# Patient Record
Sex: Female | Born: 1980 | Race: White | Hispanic: No | Marital: Married | Smoking: Former smoker
Health system: Southern US, Community
[De-identification: ages and names within clinical notes are randomized; demographics above are authoritative.]

## PROBLEM LIST (undated history)

## (undated) DIAGNOSIS — E162 Hypoglycemia, unspecified: Secondary | ICD-10-CM

## (undated) DIAGNOSIS — Z8619 Personal history of other infectious and parasitic diseases: Secondary | ICD-10-CM

## (undated) DIAGNOSIS — G7100 Muscular dystrophy, unspecified: Secondary | ICD-10-CM

## (undated) DIAGNOSIS — R0789 Other chest pain: Secondary | ICD-10-CM

## (undated) DIAGNOSIS — G7102 Facioscapulohumeral muscular dystrophy: Secondary | ICD-10-CM

## (undated) DIAGNOSIS — K219 Gastro-esophageal reflux disease without esophagitis: Secondary | ICD-10-CM

## (undated) DIAGNOSIS — F4310 Post-traumatic stress disorder, unspecified: Secondary | ICD-10-CM

## (undated) DIAGNOSIS — R51 Headache: Secondary | ICD-10-CM

## (undated) HISTORY — DX: Muscular dystrophy, unspecified: G71.00

## (undated) HISTORY — DX: Other chest pain: R07.89

## (undated) HISTORY — DX: Headache: R51

## (undated) HISTORY — PX: TONSILLECTOMY: SUR1361

## (undated) HISTORY — PX: TUBAL LIGATION: SHX77

## (undated) HISTORY — DX: Personal history of other infectious and parasitic diseases: Z86.19

---
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
## 2002-06-06 ENCOUNTER — Emergency Department (HOSPITAL_COMMUNITY): Admission: EM | Admit: 2002-06-06 | Discharge: 2002-06-06 | Payer: Self-pay | Admitting: Emergency Medicine

## 2002-07-06 ENCOUNTER — Observation Stay (HOSPITAL_COMMUNITY): Admission: AD | Admit: 2002-07-06 | Discharge: 2002-07-07 | Payer: Self-pay | Admitting: Obstetrics and Gynecology

## 2002-07-13 ENCOUNTER — Inpatient Hospital Stay (HOSPITAL_COMMUNITY): Admission: AD | Admit: 2002-07-13 | Discharge: 2002-07-17 | Payer: Self-pay | Admitting: Internal Medicine

## 2003-07-23 ENCOUNTER — Emergency Department (HOSPITAL_COMMUNITY): Admission: EM | Admit: 2003-07-23 | Discharge: 2003-07-23 | Payer: Self-pay | Admitting: Emergency Medicine

## 2004-09-13 ENCOUNTER — Other Ambulatory Visit: Admission: RE | Admit: 2004-09-13 | Discharge: 2004-09-13 | Payer: Self-pay

## 2005-12-18 ENCOUNTER — Emergency Department (HOSPITAL_COMMUNITY): Admission: EM | Admit: 2005-12-18 | Discharge: 2005-12-18 | Payer: Self-pay | Admitting: Emergency Medicine

## 2005-12-21 ENCOUNTER — Emergency Department (HOSPITAL_COMMUNITY): Admission: EM | Admit: 2005-12-21 | Discharge: 2005-12-21 | Payer: Self-pay | Admitting: Emergency Medicine

## 2006-12-28 ENCOUNTER — Emergency Department (HOSPITAL_COMMUNITY): Admission: EM | Admit: 2006-12-28 | Discharge: 2006-12-28 | Payer: Self-pay | Admitting: Emergency Medicine

## 2007-03-31 ENCOUNTER — Emergency Department (HOSPITAL_COMMUNITY): Admission: EM | Admit: 2007-03-31 | Discharge: 2007-03-31 | Payer: Self-pay | Admitting: Emergency Medicine

## 2007-08-08 ENCOUNTER — Emergency Department (HOSPITAL_COMMUNITY): Admission: EM | Admit: 2007-08-08 | Discharge: 2007-08-08 | Payer: Self-pay | Admitting: Emergency Medicine

## 2007-12-05 ENCOUNTER — Emergency Department (HOSPITAL_COMMUNITY): Admission: EM | Admit: 2007-12-05 | Discharge: 2007-12-06 | Payer: Self-pay | Admitting: Emergency Medicine

## 2008-01-14 ENCOUNTER — Emergency Department (HOSPITAL_COMMUNITY): Admission: EM | Admit: 2008-01-14 | Discharge: 2008-01-15 | Payer: Self-pay | Admitting: Emergency Medicine

## 2008-04-07 ENCOUNTER — Emergency Department (HOSPITAL_COMMUNITY): Admission: EM | Admit: 2008-04-07 | Discharge: 2008-04-08 | Payer: Self-pay | Admitting: Emergency Medicine

## 2008-04-08 ENCOUNTER — Ambulatory Visit (HOSPITAL_COMMUNITY): Admission: RE | Admit: 2008-04-08 | Discharge: 2008-04-08 | Payer: Self-pay | Admitting: Emergency Medicine

## 2008-04-09 ENCOUNTER — Emergency Department (HOSPITAL_COMMUNITY): Admission: EM | Admit: 2008-04-09 | Discharge: 2008-04-10 | Payer: Self-pay | Admitting: Emergency Medicine

## 2010-11-25 NOTE — Discharge Summary (Signed)
NAME:  [name] Hampton, [name] Hampton NO.:  [id number]   MEDICAL RECORD NO.:  [id number]                   PATIENT TYPE:  INP   LOCATION:  A428                                 FACILITY:  [facility]   PHYSICIAN:  Tilda Burrow, M.D.              DATE OF BIRTH:  [birth year]   DATE OF ADMISSION:  07/13/2002  DATE OF DISCHARGE:  07/17/2002                                 DISCHARGE SUMMARY   ADMISSION DIAGNOSES:  1. Pregnancy 40 weeks 4 days.  2. Induction.  3. Maternal fatigue and discomfort.  4. Impending postdates.   DISCHARGE DIAGNOSES:  1. Pregnancy 40 weeks 4 days, delivered.  2. Failed medical induction.  3. Cephalopelvic disproportion.   PROCEDURE:  1. Cytotec cervical ripening performed on July 13, 2002.  2. Pitocin induction of labor July 14, 2002.  3. Primary low transverse cervical cesarean section, Tilda Burrow, M.D.,     July 14, 2002.   HISTORY OF PRESENT ILLNESS:  This 30 year old female, gravida 1, para 0,  abortus 0, last menstrual period September 17, 2001, and ultrasound with [facility] of  July 25, 2002, based on a six-week ultrasound with corrected EDC based on  later ultrasound of July 09, 2002, was admitted at 40 weeks 4 days by  the later criteria after prenatal course followed initially in [place] and  transferred to our facility at 28 weeks.  The patient has been having  multiple somatic complaints over the past few weeks and is admitted as  vigorously requested by the patient and with the concern of possible  postdates issues being soon to develop.  The patient was admitted as  described in the admitting history on the evening of July 13, 2002.   HOSPITAL COURSE:  The patient was admitted with blood type A positive,  rubella immunity equivocal.  Hemoglobin 10.8, hematocrit 31.2.  Cervical  exam showed the cervix to be soft, closed, and quite high.  The patient was  admitted and begun on Cytotec cervical ripening through the  night.  She  received three doses of Cytotec 25 mcg q.4h.  The following day she was  begun on Pitocin induction at 9 a.m.  She progressed with membrane rupture  at 4:15 p.m.  The cervix was 1, thick, posterior, -3.  Induction of labor  persisted.  The presenting part never entered the pelvis.  Late on the  evening of July 14, 2002, induction was considered unsuccessful.  The  presenting part never entered the pelvic inlet.  In talking with the  patient, none of her siblings have been able to delivery vaginally either.  Primary low transverse cervical cesarean section was performed without  difficulty other than the fact that the Foley was not effectively draining.  A needle cystotomy solved that problem.  She had a healthy female infant,  Apgars of 8 and 9.  Estimated blood loss was 400 cc.  Postoperative course  was uneventful, with the patient stable for discharge on July 17, 2002,  tolerating a regular diet.   DISCHARGE MEDICATIONS:  1. HCTZ 25 mg p.o. daily x 1 week.  2. Tylox 1 q.4h. p.r.n. pain, dispense 20.  3. Micronor to be used as oral contraception.   The patient is breast feeding and will be followed up in four weeks for a  postoperative check.                                                 Tilda Burrow, M.D.    JVF/MEDQ  D:  07/17/2002  T:  07/17/2002  Job:  [id number]

## 2010-11-25 NOTE — H&P (Signed)
   [name], [name]                             ACCOUNT NO.:  [id number]   MEDICAL RECORD NO.:  [id number]                  PATIENT TYPE:   LOCATION:                                       FACILITY:   PHYSICIAN:  Tilda Burrow, M.D.              DATE OF BIRTH:   DATE OF ADMISSION:  07/13/2002  DATE OF DISCHARGE:                                HISTORY & PHYSICAL   REASON FOR ADMISSION:  Pregnancy at 40 weeks and 4 days for induction of  labor due to maternal fatigue and discomfort.   HISTORY OF PRESENT ILLNESS:  After a lengthy discussion with the patient,  the risks and benefits of induction, the patient desires induction of labor  due to fatigue and pain.   PAST MEDICAL HISTORY:  Medical history is positive for asthma and a 56-month  FSH dystrophy, ovarian cyst.   PAST SURGICAL HISTORY:  Negative.   ALLERGIES:  She is allergic to erythromycin.   MEDICATIONS:  She is on prenatal vitamins.   SOCIAL HISTORY:  She is single.  The boyfriend is present and supportive.   PRENATAL COURSE:  The prenatal course has essentially been uncomplicated.  Blood type is A positive.  Rubella is equivocal.  Hepatitis B surface  antigen is negative.  HIV is negative.  Serology is nonreactive.  GC and  Chlamydia.  Chlamydia was positive, with treatment and retest, Chlamydia was  negative. GBS is positive.  A 28-week hemoglobin 11.8, 28-week hematocrit  34.8, 1-hour glucose tolerance 54.   PHYSICAL EXAMINATION:  VITAL SIGNS:  Weight is 115-3/4.  Blood pressure  110/80.  ABDOMEN:  There is good fetal movement.  Fundal height is 35 cm.  Fetal  heart is 130 strong and regular.  PELVIC:  Cervix is soft, midposition, closed, and minus 1 station.   PLAN:  We are going to admit for Cytotec induction of labor Sunday night and  expect vaginal delivery sometime during the day on Monday, 07/14/02.     Zerita Boers, Reita Cliche, M.D.    DL/MEDQ  D:  78/29/5621  T:   07/09/2002  Job:  [id number]   cc:   [facility]

## 2010-11-25 NOTE — Op Note (Signed)
NAME:  [name] Hampton, [name] Hampton NO.:  [id number]   MEDICAL RECORD NO.:  [id number]                   PATIENT TYPE:  OBV   LOCATION:  A428                                 FACILITY:  [facility]   PHYSICIAN:  Tilda Burrow, M.D.              DATE OF BIRTH:  1980/11/24   DATE OF PROCEDURE:  DATE OF DISCHARGE:                                 OPERATIVE REPORT   PREOPERATIVE DIAGNOSIS:  Pregnancy 40 week 4 days, failed induction  (cephalopelvic disproportion).   POSTOPERATIVE DIAGNOSIS:  Pregnancy 40 week 4 days, failed induction  (cephalopelvic disproportion), delivered.   PROCEDURE:  Primary low transverse cervical cesarean section.   SURGEON:  Tilda Burrow, M.D.   ASSISTANT:  Zerita Boers, C.N.M.   ANESTHESIA:  Spinal by C. H. CRNA   COMPLICATIONS:  None.   FINDINGS:  1. Foley catheter not draining effectively and necessitating natal     cystotomy.  2. Healthy female infant weight ___________; Apgars 8 and 9.  Cared for by Marcelle Overlie, RN   COMPLICATIONS:  None.   INDICATIONS:  This is a 30 year old female admitted last night at 40 weeks 4  days for induction due to impending postdate status.  The patient has a  family history including all the girls having had C-sections and never  entering the pelvis.  The presumed ______________ was extremely high and  despite Cytotec and ripening of the cervix x12 hours followed by Pitocin  induction, the presenting part never entered the pelvis.  Membranes were  ruptured and after 12 hours of Pitocin, we proceeded with cesarean section.   DETAILS OF PROCEDURE:  The patient was taken to the operating room and  prepped and draped for lower abdominal surgery.  Anesthesia was confirmed  and a Pfannenstiel-type incision performed.  The bladder was very full and  quite high on the anterior abdominal wall.  We were able to enter the  abdomen above it.  Needle cystotomy was performed and suction attached to  the  needle and drained the bladder quite effectively.  Bladder flap was  developed on the very well-developed, thinned out, lower uterine segment.  A  transverse nick made down to the baby and then extended laterally using  index finger traction.   The fetal vertex then delivered and the baby expelled with fundal pressure.  The cord was milked towards the infant, clamped, and then the infant passed  to the waiting nurse, Ms. Duffy Rhody, RN.   The placenta then was delivered [facility] presentation; a small membrane  fragment was identified above the cervix and removed.  Antibiotic irrigation  of the uterus was followed by a single layer of running locking closure of  the uterus and 2-0 chromic bladder flap reapproximation.   The abdominal cavity was irrigated with antibiotic solution.  The anterior  peritoneum was closed using 2-0 chromic.  The bladder  began to refill quite  generously.  The rectus muscles pulled back in the midline with two  interrupted 2-0 chromic stitches; and then the fascia was closed with #0  Vicryl.  The subcutaneous tissues were reapproximated with 3 stitches of 2-0  chromic; and staple closure of the skin completed the procedure.  The  patient went to the recovery room in good condition; whereupon, the Foley  catheter was replaced in the operative room before the patient was  transferred.                                               Tilda Burrow, M.D.    JVF/MEDQ  D:  07/14/2002  T:  07/15/2002  Job:  [id number]

## 2011-04-06 LAB — URINALYSIS, ROUTINE W REFLEX MICROSCOPIC
Hgb urine dipstick: NEGATIVE
Nitrite: NEGATIVE
Protein, ur: NEGATIVE
Specific Gravity, Urine: >1.03 — ABNORMAL HIGH
Urobilinogen, UA: 0.2

## 2011-04-10 LAB — CBC
Hemoglobin: 15.6 — ABNORMAL HIGH
MCV: 90.9
RBC: 4.98
WBC: 16.5 — ABNORMAL HIGH

## 2011-04-10 LAB — URINALYSIS, ROUTINE W REFLEX MICROSCOPIC
Protein, ur: NEGATIVE
Specific Gravity, Urine: >1.03 — ABNORMAL HIGH

## 2011-04-10 LAB — DIFFERENTIAL
Eosinophils Relative: 1
Lymphocytes Relative: 5 — ABNORMAL LOW
Lymphs Abs: 0.9
Monocytes Absolute: 0.2
Monocytes Relative: 1 — ABNORMAL LOW

## 2011-04-10 LAB — HEPATIC FUNCTION PANEL
Alkaline Phosphatase: 177 — ABNORMAL HIGH
Indirect Bilirubin: 0.4
Total Protein: 5.7 — ABNORMAL LOW

## 2011-04-10 LAB — BASIC METABOLIC PANEL
CO2: 27
Calcium: 8.2 — ABNORMAL LOW
Chloride: 100
GFR calc non Af Amer: 46 — ABNORMAL LOW
Glucose, Bld: 204 — ABNORMAL HIGH

## 2011-04-10 LAB — PREGNANCY, URINE: Preg Test, Ur: NEGATIVE

## 2011-04-20 LAB — URINE CULTURE: Colony Count: >=100000

## 2011-04-20 LAB — URINALYSIS, ROUTINE W REFLEX MICROSCOPIC
Nitrite: POSITIVE — AB
Protein, ur: NEGATIVE
Specific Gravity, Urine: 1.015
Urobilinogen, UA: 0.2

## 2011-04-20 LAB — URINE MICROSCOPIC-ADD ON

## 2011-06-23 ENCOUNTER — Encounter: Payer: Self-pay | Admitting: Emergency Medicine

## 2011-06-23 ENCOUNTER — Emergency Department (HOSPITAL_COMMUNITY)
Admission: EM | Admit: 2011-06-23 | Discharge: 2011-06-23 | Disposition: A | Discharge status: Home / Self Care | Payer: Self-pay | Attending: Emergency Medicine | Admitting: Emergency Medicine

## 2011-06-23 DIAGNOSIS — J329 Chronic sinusitis, unspecified: Secondary | ICD-10-CM | POA: Insufficient documentation

## 2011-06-23 DIAGNOSIS — F17200 Nicotine dependence, unspecified, uncomplicated: Secondary | ICD-10-CM | POA: Insufficient documentation

## 2011-06-23 MED ORDER — NAPROXEN 375 MG PO TABS: 375.0000 mg | ORAL_TABLET | Freq: Two times a day (BID) | ORAL | Status: AC

## 2011-06-23 MED ORDER — SALINE NASAL SPRAY 0.65 % NA SOLN: 1.0000 | NASAL | Status: DC | PRN

## 2011-06-23 MED ORDER — OXYMETAZOLINE HCL 0.05 % NA SOLN
1.0000 | Freq: Once | NASAL | Status: AC
  Administered 2011-06-23: 1 via NASAL
  Filled 2011-06-23: qty 15

## 2011-06-23 MED ORDER — OXYMETAZOLINE HCL 0.05 % NA SOLN: 2.0000 | Freq: Two times a day (BID) | NASAL | Status: AC

## 2011-06-23 NOTE — ED Provider Notes (Signed)
History     CSN: [id number] Arrival date & time: 06/23/2011  1:21 AM   First MD Initiated Contact with Patient 06/23/11 0122      Chief Complaint  Patient presents with  . Facial Pain    (Consider location/radiation/quality/duration/timing/severity/associated sxs/prior treatment) The history is provided by the patient.   left sided sinus pressure the last 24 hours. She had some of the right side last week which is now resolved. No fevers or chills. Has had some posterior nasal drip. No nausea or vomiting. No known sick contacts. No shortness of breath or difficulty breathing. No sore throat. Patient is mostly concerned tonight because she knows someone in a serious dental infection that ultimately ended up dying from a related infection. She denies any dental pain, facial swelling, trouble swallowing or history of dental problems. She believes that she has a sinus infection. She is a smoker. Symptoms are mild to moderate in severity. No radiation of pain. Pain is sharp in quality. No other associated symptoms. No alleviating factors. She has not tried any medications for this at home.  History reviewed. No pertinent past medical history.  Past Surgical History  Procedure Date  . Cesarean section   . Abdominal surgery   . Tonsillectomy     No family history on file.  History  Substance Use Topics  . Smoking status: Current Everyday Smoker -- 0.5 packs/day    Types: Cigarettes  . Smokeless tobacco: Not on file  . Alcohol Use: Yes    OB History    Grav Para Term Preterm Abortions TAB SAB Ect Mult Living                  Review of Systems  Constitutional: Negative for fever and chills.  HENT: Positive for congestion and sinus pressure. Negative for ear pain, nosebleeds, sore throat, sneezing, drooling, mouth sores, trouble swallowing, neck pain, neck stiffness, dental problem, voice change and tinnitus.   Eyes: Negative for pain.  Respiratory: Negative for shortness of  breath.   Cardiovascular: Negative for chest pain, palpitations and leg swelling.  Gastrointestinal: Negative for abdominal pain.  Genitourinary: Negative for dysuria.  Musculoskeletal: Negative for back pain.  Skin: Negative for rash.  Neurological: Negative for headaches.  All other systems reviewed and are negative.    Allergies  Erythromycin and Peppermint oil  Home Medications  No current outpatient prescriptions on file.  BP 119/53  Pulse 88  Temp(Src) 97.6 F (36.4 C) (Oral)  Resp 18  Ht 5' 1.5" (1.562 m)  Wt 135 lb (61.236 kg)  BMI 25.10 kg/m2  SpO2 100%  LMP 06/05/2011  Physical Exam  Constitutional: She is oriented to person, place, and time. She appears well-developed and well-nourished.  HENT:  Head: Normocephalic and atraumatic.  Mouth/Throat: Oropharynx is clear and moist. No oropharyngeal exudate.       Mild nasal congestion with tenderness over left maxillary sinus. No dental tenderness or gingival swelling or associated facial swelling. No tenderness over TMJs. No trismus  Eyes: Conjunctivae and EOM are normal. Pupils are equal, round, and reactive to light.  Neck: Trachea normal. Neck supple. No thyromegaly present.  Cardiovascular: Normal rate, regular rhythm, S1 normal, S2 normal and normal pulses.     No systolic murmur is present   No diastolic murmur is present  Pulses:      Radial pulses are 2+ on the right side, and 2+ on the left side.  Pulmonary/Chest: Effort normal and breath sounds normal. She has  no wheezes. She has no rhonchi. She has no rales. She exhibits no tenderness.  Abdominal: Soft. Normal appearance and bowel sounds are normal. There is no tenderness. There is no CVA tenderness and negative Murphy's sign.  Musculoskeletal:       BLE:s Calves nontender, no cords or erythema, negative Homans sign  Neurological: She is alert and oriented to person, place, and time. She has normal strength. No cranial nerve deficit or sensory deficit.  GCS eye subscore is 4. GCS verbal subscore is 5. GCS motor subscore is 6.  Skin: Skin is warm and dry. No rash noted. She is not diaphoretic.  Psychiatric: Her speech is normal.       Cooperative and appropriate    ED Course  Procedures (including critical care time)  Afrin for nasal congestion and prescription for same with NSAIDs and primary care followup as needed.   MDM  Presentation suggest sinus infection and not dental infection. No trismus or TMJ. No meningismus or evidence of serious bacterial infection by clinical exam. Patient stable for discharge home with prescription for Afrin and Naprosyn. No indication for antibiotics at this time        Sunnie Nielsen, MD 06/23/11 [phone]

## 2011-06-23 NOTE — ED Notes (Signed)
Patient c/o left sided facial pain; states had pain on right side last week and now hurts on left cheekbone.

## 2011-07-05 DIAGNOSIS — F17200 Nicotine dependence, unspecified, uncomplicated: Secondary | ICD-10-CM | POA: Insufficient documentation

## 2011-07-05 DIAGNOSIS — J329 Chronic sinusitis, unspecified: Secondary | ICD-10-CM | POA: Insufficient documentation

## 2011-07-05 DIAGNOSIS — J3489 Other specified disorders of nose and nasal sinuses: Secondary | ICD-10-CM | POA: Insufficient documentation

## 2011-07-05 DIAGNOSIS — R51 Headache: Secondary | ICD-10-CM | POA: Insufficient documentation

## 2011-07-05 DIAGNOSIS — Z79899 Other long term (current) drug therapy: Secondary | ICD-10-CM | POA: Insufficient documentation

## 2011-07-06 ENCOUNTER — Emergency Department (HOSPITAL_COMMUNITY)
Admission: EM | Admit: 2011-07-06 | Discharge: 2011-07-06 | Disposition: A | Discharge status: Home / Self Care | Payer: Self-pay | Attending: Emergency Medicine | Admitting: Emergency Medicine

## 2011-07-06 ENCOUNTER — Encounter (HOSPITAL_COMMUNITY): Payer: Self-pay | Admitting: Emergency Medicine

## 2011-07-06 DIAGNOSIS — J329 Chronic sinusitis, unspecified: Secondary | ICD-10-CM

## 2011-07-06 MED ORDER — ACETAMINOPHEN 500 MG PO TABS
1000.0000 mg | ORAL_TABLET | Freq: Once | ORAL | Status: AC
  Administered 2011-07-06: 1000 mg via ORAL
  Filled 2011-07-06: qty 2

## 2011-07-06 MED ORDER — DOXYCYCLINE HYCLATE 100 MG PO CAPS: 100.0000 mg | ORAL_CAPSULE | Freq: Two times a day (BID) | ORAL | Status: AC

## 2011-07-06 MED ORDER — IBUPROFEN 800 MG PO TABS
800.0000 mg | ORAL_TABLET | Freq: Once | ORAL | Status: AC
  Administered 2011-07-06: 800 mg via ORAL
  Filled 2011-07-06: qty 1

## 2011-07-06 MED ORDER — PSEUDOEPHEDRINE HCL 60 MG PO TABS
60.0000 mg | ORAL_TABLET | Freq: Once | ORAL | Status: AC
  Administered 2011-07-06: 60 mg via ORAL
  Filled 2011-07-06: qty 1

## 2011-07-06 MED ORDER — DOXYCYCLINE HYCLATE 100 MG PO TABS
100.0000 mg | ORAL_TABLET | Freq: Once | ORAL | Status: AC
  Administered 2011-07-06: 100 mg via ORAL
  Filled 2011-07-06: qty 1

## 2011-07-06 MED ORDER — HYDROCODONE-ACETAMINOPHEN 5-500 MG PO TABS: 1.0000 | ORAL_TABLET | ORAL | Status: AC | PRN

## 2011-07-06 MED ORDER — PSEUDOEPHEDRINE HCL 60 MG PO TABS: 60.0000 mg | ORAL_TABLET | ORAL | Status: AC | PRN

## 2011-07-06 MED ORDER — ONDANSETRON HCL 4 MG PO TABS
4.0000 mg | ORAL_TABLET | Freq: Once | ORAL | Status: AC
  Administered 2011-07-06: 4 mg via ORAL
  Filled 2011-07-06: qty 1

## 2011-07-06 NOTE — ED Provider Notes (Signed)
History     CSN: [id number]  Arrival date & time 07/05/11  2355   First MD Initiated Contact with Patient 07/05/11 2359      Chief Complaint  Patient presents with  . Facial Pain    (Consider location/radiation/quality/duration/timing/severity/associated sxs/prior treatment) HPI Comments: Patient complains of right-sided facial pain. It is of note the patient was treated on December 14 for similar type of pain on the left side of the face. She was diagnosed with sinus infection. And treated with NSAID and Afrin spray. The patient states that she continues to have pain. The pain got worse today to the point of hurting with smiling and hurting with chewing at times. The patient denies any recent injury. She denies high fevers. She does report that friends have noticed swelling of her face from time to time during this past one to 2 week period.  Patient states she has had chronic recurrent sinus infections. She has not seen a specialist because of financial situations. She states that usually if she gets an antibiotic the problem resolves quickly. She requests to be evaluated and request an antibiotic tonight.  The history is provided by the patient.    History reviewed. No pertinent past medical history.  Past Surgical History  Procedure Date  . Cesarean section   . Abdominal surgery   . Tonsillectomy     No family history on file.  History  Substance Use Topics  . Smoking status: Current Everyday Smoker -- 0.5 packs/day    Types: Cigarettes  . Smokeless tobacco: Not on file  . Alcohol Use: Yes    OB History    Grav Para Term Preterm Abortions TAB SAB Ect Mult Living                  Review of Systems  Constitutional: Positive for appetite change.  HENT: Positive for facial swelling and sinus pressure. Negative for voice change.     Allergies  Erythromycin and Peppermint oil  Home Medications   Current Outpatient Rx  Name Route Sig Dispense Refill  .  DOXYCYCLINE HYCLATE 100 MG PO CAPS Oral Take 1 capsule (100 mg total) by mouth 2 (two) times daily. 14 capsule 0  . HYDROCODONE-ACETAMINOPHEN 5-500 MG PO TABS Oral Take 1-2 tablets by mouth every 4 (four) hours as needed for pain. 20 tablet 0  . NAPROXEN 375 MG PO TABS Oral Take 1 tablet (375 mg total) by mouth 2 (two) times daily. 20 tablet 0  . PSEUDOEPHEDRINE HCL 60 MG PO TABS Oral Take 1 tablet (60 mg total) by mouth every 4 (four) hours as needed for congestion. 24 tablet 0  . SALINE NASAL SPRAY 0.65 % NA SOLN Nasal Place 1 spray into the nose as needed for congestion. 30 mL 12    BP 126/82  Pulse 83  Temp(Src) 97.5 F (36.4 C) (Oral)  Resp 18  Ht 5' 1.5" (1.562 m)  Wt 135 lb (61.236 kg)  BMI 25.10 kg/m2  SpO2 100%  LMP 05/29/2011  Physical Exam  Nursing note and vitals reviewed. Constitutional: She is oriented to person, place, and time. She appears well-developed and well-nourished.  Non-toxic appearance. No distress.  HENT:  Head: Normocephalic.  Right Ear: Tympanic membrane and external ear normal.  Left Ear: Tympanic membrane and external ear normal.       External auditory canals and tympanic membranes are clear bilaterally. There is no swelling or exudate of the posterior pharynx.there is pain to percussion over the  sinuses on the right. There is no increased warmth of the right or left face. No visible dental abscess or significant gum swelling.  Eyes: EOM and lids are normal. Pupils are equal, round, and reactive to light.  Neck: Normal range of motion. Neck supple. Carotid bruit is not present.  Cardiovascular: Normal rate, regular rhythm, normal heart sounds, intact distal pulses and normal pulses.   Pulmonary/Chest: Breath sounds normal. No respiratory distress.  Abdominal: Soft. Bowel sounds are normal. There is no tenderness. There is no guarding.  Musculoskeletal: Normal range of motion.  Lymphadenopathy:       Head (right side): No submandibular adenopathy  present.       Head (left side): No submandibular adenopathy present.    She has no cervical adenopathy.  Neurological: She is alert and oriented to person, place, and time. She has normal strength. No cranial nerve deficit or sensory deficit.  Skin: Skin is warm and dry.  Psychiatric: She has a normal mood and affect. Her speech is normal.    ED Course  Procedures (including critical care time)  Labs Reviewed - No data to display No results found.   1. Sinus infection       MDM  I have reviewed nursing notes, vital signs, and all appropriate lab and imaging results for this patient. Suspect sinus infection. Vital signs are stable. Since there is a history of recurrent infection, with response to antibiotic therapy. Will treat with doxycycline, as patient to continue Naprosyn, and add Vicodin for assistance with pain. Referral to ear nose and throat strongly suggested to the patient.       Kathie Dike, [place] 07/06/11 [phone]

## 2011-07-06 NOTE — ED Provider Notes (Signed)
Medical screening examination/treatment/procedure(s) were performed by non-physician practitioner and as supervising physician I was immediately available for consultation/collaboration.   Lyanne Co, MD 07/06/11 [phone]

## 2011-07-06 NOTE — ED Notes (Signed)
Patient c/o right sided facial pain; states was seen here about 2 weeks ago for same thing on left side.

## 2011-07-12 ENCOUNTER — Emergency Department (HOSPITAL_COMMUNITY): Payer: Self-pay

## 2011-07-12 ENCOUNTER — Encounter (HOSPITAL_COMMUNITY): Payer: Self-pay

## 2011-07-12 ENCOUNTER — Emergency Department (HOSPITAL_COMMUNITY)
Admission: EM | Admit: 2011-07-12 | Discharge: 2011-07-12 | Disposition: A | Discharge status: Home / Self Care | Payer: Self-pay | Attending: Emergency Medicine | Admitting: Emergency Medicine

## 2011-07-12 DIAGNOSIS — G7109 Other specified muscular dystrophies: Secondary | ICD-10-CM | POA: Insufficient documentation

## 2011-07-12 DIAGNOSIS — J019 Acute sinusitis, unspecified: Secondary | ICD-10-CM | POA: Insufficient documentation

## 2011-07-12 DIAGNOSIS — J45909 Unspecified asthma, uncomplicated: Secondary | ICD-10-CM | POA: Insufficient documentation

## 2011-07-12 DIAGNOSIS — F17200 Nicotine dependence, unspecified, uncomplicated: Secondary | ICD-10-CM | POA: Insufficient documentation

## 2011-07-12 DIAGNOSIS — J329 Chronic sinusitis, unspecified: Secondary | ICD-10-CM

## 2011-07-12 HISTORY — DX: Facioscapulohumeral muscular dystrophy: G71.02

## 2011-07-12 MED ORDER — DOXYCYCLINE HYCLATE 100 MG PO CAPS: 100.0000 mg | ORAL_CAPSULE | Freq: Two times a day (BID) | ORAL | Status: AC

## 2011-07-12 MED ORDER — HYDROCODONE-ACETAMINOPHEN 5-325 MG PO TABS: 1.0000 | ORAL_TABLET | Freq: Four times a day (QID) | ORAL | Status: AC | PRN

## 2011-07-12 NOTE — ED Notes (Signed)
Pt reports approx three weeks ago she began having jaw pain that alternated between the right and left sides.  Pt states she was seen in ED and given antibiotic.  Pt reports pain has gotten worse and she is unable to sleep.  Pt reports pain is primarily on right side.  Pt reports soreness that starts behind right ear and radiates to right side of mouth.  NAD at this time.

## 2011-07-12 NOTE — ED Notes (Signed)
Pt presents with right sided facial and jaw pain x 3 weeks.

## 2011-07-12 NOTE — ED Notes (Signed)
Patient is alert and oriented x 4 with respirations even and unlabored.  NAD at this time.  Discharge instructions reviewed with patient and patient verbalized understanding.  Pt ambulated to lobby with steady gait, and family member to transport pt home.  

## 2011-07-12 NOTE — ED Provider Notes (Signed)
History     CSN: [id number]  Arrival date & time 07/12/11  0056   First MD Initiated Contact with Patient 07/12/11 0104      Chief Complaint  Patient presents with  . Facial Pain    (Consider location/radiation/quality/duration/timing/severity/associated sxs/prior treatment) HPI This is a 31 year old white female with a history of facial pain for 3 weeks. She originally had some aching pain in her left maxillary area which was consistent with previous sinus infections. She was treated in the ED first sinusitis on 2 occasions, being placed on naproxen, hydrocodone and doxycycline. Despite treatment she's developed moderate to severe pain of the right side of the face in the maxillary and mandibular regions. The pain is not sharp or stabbing. The pain is constant but worsens and wanes. It is a deep dull pain. It is not like pain of sinusitis. There is mild exacerbation with extension of the tongue but not with movement of the jaw. There is no TMJ pain. She has a subjective sensation that the right side of her tongue is swollen. She denies toothache. She denies motor dysfunction of the jaw.  Past Medical History  Diagnosis Date  . FSHD (facioscapulohumeral muscular dystrophy)   . Asthma     Past Surgical History  Procedure Date  . Cesarean section   . Abdominal surgery   . Tonsillectomy     No family history on file.  History  Substance Use Topics  . Smoking status: Current Everyday Smoker -- 0.5 packs/day    Types: Cigarettes  . Smokeless tobacco: Not on file  . Alcohol Use: Yes    OB History    Grav Para Term Preterm Abortions TAB SAB Ect Mult Living                  Review of Systems  All other systems reviewed and are negative.    Allergies  Erythromycin and Peppermint oil  Home Medications   Current Outpatient Rx  Name Route Sig Dispense Refill  . DOXYCYCLINE HYCLATE 100 MG PO CAPS Oral Take 1 capsule (100 mg total) by mouth 2 (two) times daily. 14 capsule  0  . HYDROCODONE-ACETAMINOPHEN 5-500 MG PO TABS Oral Take 1-2 tablets by mouth every 4 (four) hours as needed for pain. 20 tablet 0  . NAPROXEN 375 MG PO TABS Oral Take 1 tablet (375 mg total) by mouth 2 (two) times daily. 20 tablet 0  . PSEUDOEPHEDRINE HCL 60 MG PO TABS Oral Take 1 tablet (60 mg total) by mouth every 4 (four) hours as needed for congestion. 24 tablet 0  . SALINE NASAL SPRAY 0.65 % NA SOLN Nasal Place 1 spray into the nose as needed for congestion. 30 mL 12    BP 111/78  Pulse 81  Temp(Src) 97.4 F (36.3 C) (Oral)  Resp 20  Ht 5' 1.5" (1.562 m)  Wt 135 lb (61.236 kg)  BMI 25.10 kg/m2  SpO2 100%  LMP 06/29/2011  Physical Exam General: Well-developed, well-nourished female in no acute distress; appearance consistent with age of record HENT: normocephalic, atraumatic; no dental caries visualize; no TMJ tenderness; no edema or asymmetry of tongue; mild tenderness of right lower mandible Eyes: pupils equal round and reactive to light; extraocular muscles intact Neck: supple; no lymphadenopathy Heart: regular rate and rhythm Lungs: normal respiratory effort and excursion Abdomen: soft; nondistended Extremities: No deformity; full range of motion Neurologic: Awake, alert and oriented; motor function intact in all extremities and symmetric; no facial droop; facial sensation  symmetric without hyperesthesia Skin: Warm and dry     ED Course  Procedures (including critical care time)     MDM   Nursing notes and vitals signs, including pulse oximetry, reviewed.  Summary of this visit's results, reviewed by myself:   Imaging Studies: Ct Maxillofacial Wo Cm  July 14, 2011  *RADIOLOGY REPORT*  Clinical Data: Right-sided facial pain.  No known injury.  Left- sided sinus infection.  CT MAXILLOFACIAL WITHOUT CONTRAST  Technique:  Multidetector CT imaging of the maxillofacial structures was performed. Multiplanar CT image reconstructions were also generated.  Comparison:  None.  Findings: Air fluid level in the left maxillary antrum with mucous membrane thickening also in the left maxillary antrum. Opacification of left ethmoid air cells and left frontal sinus. Changes are consistent with acute sinusitis.  The right sided paranasal sinuses and sphenoid sinuses are not opacified.  Mastoid air cells are not opacified.  The globes and extraocular muscles appear intact and symmetrical.  No soft tissue infiltration.  The orbital rims, maxillary antral walls, nasal bones, nasal septum, pterygoid plates, zygomatic arches, mandibles, and temporomandibular joints are intact without evidence of depressed fracture.  There is mild opacification in the ostiomeatal complexes bilaterally caused by mucosal membrane thickening.  Mild concha bullosa of the middle turbinates.  Nasal septum is not thickened. Cervical lymph nodes are not pathologically enlarged.  IMPRESSION: Changes of acute sinusitis involving the left maxillary antrum, ethmoid air cells, and frontal sinus.  Right paranasal sinuses appear clear although there is some membrane thickening causing effacement of the right ostiomeatal complex.  Original Report Authenticated By: Marlon Pel, M.D.    2:12 AM The patient clearly has left-sided sinusitis. The patient was only placed on one week of doxycycline. We will extend this for a second week is the EMRA guidelines recommend 10 to 14 days of treatment with doxycycline for acute sinusitis.   The location and nature of the patient's pain is concerning for a variant of trigeminal neuralgia. We will continue the patient's analgesics and refer to [facility] for further evaluation.        Hanley Seamen, MD 14-Jul-2011 [phone]

## 2011-12-27 LAB — OB RESULTS CONSOLE RPR: RPR: NONREACTIVE

## 2011-12-27 LAB — OB RESULTS CONSOLE HIV ANTIBODY (ROUTINE TESTING): HIV: NONREACTIVE

## 2012-05-30 ENCOUNTER — Other Ambulatory Visit (HOSPITAL_COMMUNITY): Payer: Self-pay | Admitting: Obstetrics and Gynecology

## 2012-07-17 ENCOUNTER — Emergency Department (HOSPITAL_COMMUNITY)
Admission: EM | Admit: 2012-07-17 | Discharge: 2012-07-18 | Disposition: A | Discharge status: Home / Self Care | Payer: Medicaid Other | Attending: Emergency Medicine | Admitting: Emergency Medicine

## 2012-07-17 ENCOUNTER — Encounter (HOSPITAL_COMMUNITY): Payer: Self-pay

## 2012-07-17 DIAGNOSIS — R6883 Chills (without fever): Secondary | ICD-10-CM | POA: Insufficient documentation

## 2012-07-17 DIAGNOSIS — O99330 Smoking (tobacco) complicating pregnancy, unspecified trimester: Secondary | ICD-10-CM | POA: Insufficient documentation

## 2012-07-17 DIAGNOSIS — Z79899 Other long term (current) drug therapy: Secondary | ICD-10-CM | POA: Insufficient documentation

## 2012-07-17 DIAGNOSIS — J45909 Unspecified asthma, uncomplicated: Secondary | ICD-10-CM | POA: Insufficient documentation

## 2012-07-17 DIAGNOSIS — O98819 Other maternal infectious and parasitic diseases complicating pregnancy, unspecified trimester: Secondary | ICD-10-CM | POA: Insufficient documentation

## 2012-07-17 DIAGNOSIS — J029 Acute pharyngitis, unspecified: Secondary | ICD-10-CM | POA: Insufficient documentation

## 2012-07-17 DIAGNOSIS — R05 Cough: Secondary | ICD-10-CM | POA: Insufficient documentation

## 2012-07-17 DIAGNOSIS — Z8739 Personal history of other diseases of the musculoskeletal system and connective tissue: Secondary | ICD-10-CM | POA: Insufficient documentation

## 2012-07-17 DIAGNOSIS — R059 Cough, unspecified: Secondary | ICD-10-CM | POA: Insufficient documentation

## 2012-07-17 DIAGNOSIS — IMO0001 Reserved for inherently not codable concepts without codable children: Secondary | ICD-10-CM | POA: Insufficient documentation

## 2012-07-17 NOTE — ED Notes (Signed)
Sore throat onset today, states has bad acid reflux and meds she is taking have not helped, also has had cough and scratchy throat

## 2012-07-18 MED ORDER — AZITHROMYCIN 250 MG PO TABS
500.0000 mg | ORAL_TABLET | Freq: Once | ORAL | Status: AC
  Administered 2012-07-18: 500 mg via ORAL
  Filled 2012-07-18: qty 2

## 2012-07-18 MED ORDER — CEFTRIAXONE SODIUM 1 G IJ SOLR
1.0000 g | Freq: Once | INTRAMUSCULAR | Status: AC
  Administered 2012-07-18: 1 g via INTRAMUSCULAR
  Filled 2012-07-18: qty 10

## 2012-07-18 MED ORDER — LIDOCAINE HCL (PF) 1 % IJ SOLN
INTRAMUSCULAR | Status: AC
  Administered 2012-07-18: 2.1 mL via INTRAMUSCULAR
  Filled 2012-07-18: qty 5

## 2012-07-18 MED ORDER — AZITHROMYCIN 250 MG PO TABS: ORAL_TABLET | ORAL | Status: DC

## 2012-07-18 NOTE — ED Provider Notes (Signed)
History     CSN: [id number]  Arrival date & time 07/17/12  2326   First MD Initiated Contact with Patient 07/18/12 0014      Chief Complaint  Patient presents with  . Sore Throat    (Consider location/radiation/quality/duration/timing/severity/associated sxs/prior treatment) HPI Comments: Pt is [redacted] weeks pregnant.  Scheduled for c-section on jan 30-14 by dr. Emelda Fear.  Has had scratchy throat and prod cough ~ 1 week.  Concerned she may have strep or PNA.  Patient is a 32 y.o. female presenting with pharyngitis. The history is provided by the patient. No language interpreter was used.  Sore Throat This is a new problem. Episode onset: 1 week. The problem has been gradually worsening. Associated symptoms include chills, coughing, myalgias and a sore throat. Pertinent negatives include no fever, nausea, rash, urinary symptoms or vomiting. Nothing aggravates the symptoms. She has tried nothing for the symptoms.    Past Medical History  Diagnosis Date  . FSHD (facioscapulohumeral muscular dystrophy)   . Asthma   . Pregnant     Past Surgical History  Procedure Date  . Cesarean section   . Abdominal surgery   . Tonsillectomy     No family history on file.  History  Substance Use Topics  . Smoking status: Current Every Day Smoker -- 0.5 packs/day    Types: Cigarettes  . Smokeless tobacco: Not on file  . Alcohol Use: No    OB History    Grav Para Term Preterm Abortions TAB SAB Ect Mult Living   1               Review of Systems  Constitutional: Positive for chills. Negative for fever.  HENT: Positive for sore throat.   Respiratory: Positive for cough.   Gastrointestinal: Negative for nausea and vomiting.  Musculoskeletal: Positive for myalgias.  Skin: Negative for rash.  All other systems reviewed and are negative.    Allergies  Erythromycin and Peppermint oil  Home Medications   Current Outpatient Rx  Name  Route  Sig  Dispense  Refill  . PANTOPRAZOLE  SODIUM 40 MG PO TBEC   Oral   Take 40 mg by mouth 2 (two) times daily.         Marland Kitchen PRENATAL MULTIVITAMIN CH   Oral   Take 1 tablet by mouth daily.         . AZITHROMYCIN 250 MG PO TABS      One tab po QD (initial dose given in the ED)   4 tablet   0   . SALINE NASAL SPRAY 0.65 % NA SOLN   Nasal   Place 1 spray into the nose as needed for congestion.   30 mL   12     BP 134/73  Pulse 113  Temp 97.9 F (36.6 C) (Oral)  Resp 18  Ht 5' 1.5" (1.562 m)  Wt 175 lb 8 oz (79.606 kg)  BMI 32.62 kg/m2  SpO2 99%  LMP 06/29/2011  Physical Exam  Nursing note and vitals reviewed. Constitutional: She is oriented to person, place, and time. She appears well-developed and well-nourished. No distress.  HENT:  Head: Normocephalic and atraumatic.  Mouth/Throat: Uvula is midline and mucous membranes are normal. No uvula swelling. Posterior oropharyngeal erythema present. No oropharyngeal exudate, posterior oropharyngeal edema or tonsillar abscesses.  Eyes: EOM are normal.  Neck: Normal range of motion.  Cardiovascular: Normal rate, regular rhythm and normal heart sounds.   Pulmonary/Chest: Effort normal and breath sounds normal.  No accessory muscle usage. Not tachypneic. No respiratory distress. She has no decreased breath sounds. She has no wheezes. She has no rhonchi. She has no rales.  Abdominal: Soft. She exhibits no distension. There is no tenderness.  Musculoskeletal: Normal range of motion.  Neurological: She is alert and oriented to person, place, and time.  Skin: Skin is warm and dry.  Psychiatric: She has a normal mood and affect. Judgment normal.    ED Course  Procedures (including critical care time)  Labs Reviewed - No data to display No results found.   1. Pharyngitis   2. Cough       MDM  rx-zithromax 250 mg x 4 days  Rocephin 1 gm IM and zithromax 500 mg po given in the ED.        Evalina Field, [place] 07/18/12 [phone]

## 2012-07-18 NOTE — ED Provider Notes (Signed)
Medical screening examination/treatment/procedure(s) were performed by non-physician practitioner and as supervising physician I was immediately available for consultation/collaboration. Devoria Albe, MD, FACEP   Ward Givens, MD 07/18/12 [phone]

## 2012-07-31 ENCOUNTER — Encounter (HOSPITAL_COMMUNITY): Payer: Self-pay | Admitting: Pharmacist

## 2012-07-31 ENCOUNTER — Encounter: Payer: Self-pay | Admitting: *Deleted

## 2012-07-31 ENCOUNTER — Encounter (HOSPITAL_COMMUNITY): Payer: Self-pay

## 2012-07-31 DIAGNOSIS — Z302 Encounter for sterilization: Secondary | ICD-10-CM | POA: Insufficient documentation

## 2012-08-01 ENCOUNTER — Encounter (HOSPITAL_COMMUNITY): Payer: Self-pay

## 2012-08-01 ENCOUNTER — Encounter (HOSPITAL_COMMUNITY)
Admission: RE | Admit: 2012-08-01 | Discharge: 2012-08-01 | Disposition: A | Discharge status: Home / Self Care | Payer: Medicaid Other | Source: Ambulatory Visit | Attending: Obstetrics and Gynecology | Admitting: Obstetrics and Gynecology

## 2012-08-01 HISTORY — DX: Gastro-esophageal reflux disease without esophagitis: K21.9

## 2012-08-01 LAB — CBC
Hemoglobin: 9.8 g/dL — ABNORMAL LOW (ref 12.0–15.0)
MCH: 27.5 pg (ref 26.0–34.0)
MCV: 81.5 fL (ref 78.0–100.0)
RBC: 3.56 MIL/uL — ABNORMAL LOW (ref 3.87–5.11)

## 2012-08-01 LAB — ABO/RH: ABO/RH(D): A POS

## 2012-08-01 LAB — TYPE AND SCREEN
ABO/RH(D): A POS
Antibody Screen: NEGATIVE

## 2012-08-01 NOTE — Patient Instructions (Addendum)
20 ELSI STELZER  08/01/2012   Your procedure is scheduled on:  08/08/12  Enter through the Main Entrance of [facility] at 8 AM.  Pick up the phone at the desk and dial [phone].   Call this number if you have problems the morning of surgery: [phone]   Remember:   Do not eat food:After Midnight.  Do not drink clear liquids: After Midnight.  Take these medicines the morning of surgery with A SIP OF WATER: Protonix   Do not wear jewelry, make-up or nail polish.  Do not wear lotions, powders, or perfumes. You may wear deodorant.  Do not shave 48 hours prior to surgery.  Do not bring valuables to the hospital.  Contacts, dentures or bridgework may not be worn into surgery.  Leave suitcase in the car. After surgery it may be brought to your room.  For patients admitted to the hospital, checkout time is 11:00 AM the day of discharge.   Patients discharged the day of surgery will not be allowed to drive home.  Name and phone number of your driver: NA  Special Instructions: Shower using CHG 2 nights before surgery and the night before surgery.  If you shower the day of surgery use CHG.  Use special wash - you have one bottle of CHG for all showers.  You should use approximately 1/3 of the bottle for each shower.   Please read over the following fact sheets that you were given: MRSA Information

## 2012-08-07 ENCOUNTER — Encounter (HOSPITAL_COMMUNITY): Payer: Self-pay | Admitting: Obstetrics and Gynecology

## 2012-08-07 MED ORDER — CEFAZOLIN SODIUM-DEXTROSE 2-3 GM-% IV SOLR: 2.0000 g | INTRAVENOUS | Status: DC

## 2012-08-07 NOTE — H&P (Signed)
  [name] Hampton is a 32 y.o. female presenting for Repeat Cesarean Section and Tubal ligation  EDC is 08/14/12, pt is 39.0 weeks . History OB History    Grav Para Term Preterm Abortions TAB SAB Ect Mult Living   2 1        1      Past Medical History  Diagnosis Date  . Pregnant   . Asthma     exercised induced with preg only  . FSHD (facioscapulohumeral muscular dystrophy)   . GERD (gastroesophageal reflux disease)    Past Surgical History  Procedure Date  . Cesarean section   . Abdominal surgery   . Tonsillectomy   . Diagnostic laparoscopy    Family History: family history is not on file. Social History:  reports that she has been smoking Cigarettes.  She has been smoking about .5 packs per day. She does not have any smokeless tobacco history on file. She reports that she does not drink alcohol or use illicit drugs.   Prenatal Transfer Tool  Maternal Diabetes: No Genetic Screening: Abnormal:  Results: Elevated risk of Trisomy 21 of 1:120, with normal ultrasound Maternal Ultrasounds/Referrals: Abnormal:  Findings:   Other:small chorioid plexus cyst   Fetal Ultrasounds or other Referrals:  None Maternal Substance Abuse:  Yes:  Type: Smoker 1-2 cig / day Significant Maternal Medications:  None Significant Maternal Lab Results:  None Other Comments:  None  ROS    Last menstrual period 11/08/2011. ExamPhysical Examination: General appearance - alert, well appearing, and in no distress, oriented to person, place, and time, normal appearing weight and wt 179, BP106/66  Physical Exam Physical Examination: Mental status - alert, oriented to person, place, and time Chest - clear to auscultation, no wheezes, rales or rhonchi, symmetric air entry Heart - normal rate and regular rhythm Abdomen - soft, nontender, nondistended, no masses or organomegaly Gravid uterus, c//w dates Extremities - peripheral pulses normal, no pedal edema, no clubbing or cyanosis, Homan's sign negative  bilaterally  Prenatal labs: ABO, Rh: --/--/A POS, A POS (01/23 1100) Antibody: NEG (01/23 1100) Rubella: Equivocal (06/19 0839) RPR: NON REACTIVE (01/23 1100)  HBsAg: Negative (06/19 0839)  HIV: Non-reactive (06/19 0839)  GBS:   done 1/24  Assessment/Plan: Preg 39 weeks, prior cesarean not for trial of labor,  Desire for permanent sterilization.. Bilateral small fetal chorioid plexus cysts,     Cabella Kimm V 08/07/2012, 8:48 PM

## 2012-08-08 ENCOUNTER — Inpatient Hospital Stay (HOSPITAL_COMMUNITY): Payer: Medicaid Other | Admitting: Anesthesiology

## 2012-08-08 ENCOUNTER — Encounter (HOSPITAL_COMMUNITY): Payer: Self-pay | Admitting: *Deleted

## 2012-08-08 ENCOUNTER — Encounter (HOSPITAL_COMMUNITY): Payer: Self-pay | Admitting: Anesthesiology

## 2012-08-08 ENCOUNTER — Encounter (HOSPITAL_COMMUNITY)
Admission: AD | Disposition: A | Discharge status: Home / Self Care | Payer: Self-pay | Source: Ambulatory Visit | Attending: Obstetrics and Gynecology

## 2012-08-08 ENCOUNTER — Inpatient Hospital Stay (HOSPITAL_COMMUNITY)
Admission: AD | Admit: 2012-08-08 | Discharge: 2012-08-10 | DRG: 765 | Disposition: A | Discharge status: Home / Self Care | Payer: Medicaid Other | Source: Ambulatory Visit | Attending: Obstetrics and Gynecology | Admitting: Obstetrics and Gynecology

## 2012-08-08 DIAGNOSIS — Z302 Encounter for sterilization: Secondary | ICD-10-CM

## 2012-08-08 DIAGNOSIS — G930 Cerebral cysts: Secondary | ICD-10-CM | POA: Diagnosis present

## 2012-08-08 DIAGNOSIS — IMO0002 Reserved for concepts with insufficient information to code with codable children: Secondary | ICD-10-CM | POA: Diagnosis not present

## 2012-08-08 DIAGNOSIS — O34219 Maternal care for unspecified type scar from previous cesarean delivery: Principal | ICD-10-CM | POA: Diagnosis present

## 2012-08-08 DIAGNOSIS — Y9210 Unspecified residential institution as the place of occurrence of the external cause: Secondary | ICD-10-CM | POA: Diagnosis not present

## 2012-08-08 LAB — GLUCOSE, CAPILLARY: Glucose-Capillary: 102 mg/dL — ABNORMAL HIGH (ref 70–99)

## 2012-08-08 SURGERY — Surgical Case
Anesthesia: Spinal | Wound class: Clean Contaminated

## 2012-08-08 MED ORDER — TETANUS-DIPHTH-ACELL PERTUSSIS 5-2.5-18.5 LF-MCG/0.5 IM SUSP
0.5000 mL | Freq: Once | INTRAMUSCULAR | Status: AC
  Administered 2012-08-10: 0.5 mL via INTRAMUSCULAR
  Filled 2012-08-08: qty 0.5

## 2012-08-08 MED ORDER — DIPHENHYDRAMINE HCL 25 MG PO CAPS: 25.0000 mg | ORAL_CAPSULE | Freq: Four times a day (QID) | ORAL | Status: DC | PRN

## 2012-08-08 MED ORDER — WITCH HAZEL-GLYCERIN EX PADS: 1.0000 "application " | MEDICATED_PAD | CUTANEOUS | Status: DC | PRN

## 2012-08-08 MED ORDER — LACTATED RINGERS IV SOLN
INTRAVENOUS | Status: DC | PRN
  Administered 2012-08-08 (×2): via INTRAVENOUS

## 2012-08-08 MED ORDER — BUPIVACAINE IN DEXTROSE 0.75-8.25 % IT SOLN
INTRATHECAL | Status: DC | PRN
  Administered 2012-08-08: 1.4 mL via INTRATHECAL

## 2012-08-08 MED ORDER — DIPHENHYDRAMINE HCL 25 MG PO CAPS
25.0000 mg | ORAL_CAPSULE | ORAL | Status: DC | PRN
  Administered 2012-08-08: 25 mg via ORAL
  Filled 2012-08-08: qty 1

## 2012-08-08 MED ORDER — CEFAZOLIN SODIUM-DEXTROSE 2-3 GM-% IV SOLR
INTRAVENOUS | Status: AC
  Administered 2012-08-08: 2 g via INTRAVENOUS
  Filled 2012-08-08: qty 50

## 2012-08-08 MED ORDER — NALOXONE HCL 1 MG/ML IJ SOLN
1.0000 ug/kg/h | INTRAMUSCULAR | Status: DC | PRN
  Filled 2012-08-08: qty 2

## 2012-08-08 MED ORDER — ZOLPIDEM TARTRATE 5 MG PO TABS: 5.0000 mg | ORAL_TABLET | Freq: Every evening | ORAL | Status: DC | PRN

## 2012-08-08 MED ORDER — SCOPOLAMINE 1 MG/3DAYS TD PT72
MEDICATED_PATCH | TRANSDERMAL | Status: AC
  Administered 2012-08-08: 1.5 mg via TRANSDERMAL
  Filled 2012-08-08: qty 1

## 2012-08-08 MED ORDER — NALOXONE HCL 0.4 MG/ML IJ SOLN: 0.4000 mg | INTRAMUSCULAR | Status: DC | PRN

## 2012-08-08 MED ORDER — PHENYLEPHRINE HCL 10 MG/ML IJ SOLN
INTRAMUSCULAR | Status: DC | PRN
  Administered 2012-08-08 (×2): 40 ug via INTRAVENOUS
  Administered 2012-08-08 (×4): 80 ug via INTRAVENOUS
  Administered 2012-08-08: 40 ug via INTRAVENOUS
  Administered 2012-08-08: 80 ug via INTRAVENOUS

## 2012-08-08 MED ORDER — OXYTOCIN 10 UNIT/ML IJ SOLN
INTRAMUSCULAR | Status: AC
  Filled 2012-08-08: qty 4

## 2012-08-08 MED ORDER — MORPHINE SULFATE (PF) 0.5 MG/ML IJ SOLN
INTRAMUSCULAR | Status: DC | PRN
  Administered 2012-08-08: .15 mg via INTRATHECAL

## 2012-08-08 MED ORDER — LACTATED RINGERS IV SOLN
INTRAVENOUS | Status: DC | PRN
  Administered 2012-08-08: 10:00:00 via INTRAVENOUS

## 2012-08-08 MED ORDER — NALBUPHINE HCL 10 MG/ML IJ SOLN
5.0000 mg | INTRAMUSCULAR | Status: DC | PRN
  Filled 2012-08-08: qty 1

## 2012-08-08 MED ORDER — PHENYLEPHRINE 40 MCG/ML (10ML) SYRINGE FOR IV PUSH (FOR BLOOD PRESSURE SUPPORT)
PREFILLED_SYRINGE | INTRAVENOUS | Status: AC
  Filled 2012-08-08: qty 10

## 2012-08-08 MED ORDER — EPHEDRINE SULFATE 50 MG/ML IJ SOLN
INTRAMUSCULAR | Status: DC | PRN
  Administered 2012-08-08 (×6): 10 mg via INTRAVENOUS

## 2012-08-08 MED ORDER — METOCLOPRAMIDE HCL 5 MG/ML IJ SOLN: 10.0000 mg | Freq: Three times a day (TID) | INTRAMUSCULAR | Status: DC | PRN

## 2012-08-08 MED ORDER — ONDANSETRON HCL 4 MG PO TABS: 4.0000 mg | ORAL_TABLET | ORAL | Status: DC | PRN

## 2012-08-08 MED ORDER — 0.9 % SODIUM CHLORIDE (POUR BTL) OPTIME
TOPICAL | Status: DC | PRN
  Administered 2012-08-08: 1000 mL

## 2012-08-08 MED ORDER — MENTHOL 3 MG MT LOZG: 1.0000 | LOZENGE | OROMUCOSAL | Status: DC | PRN

## 2012-08-08 MED ORDER — OXYTOCIN 40 UNITS IN LACTATED RINGERS INFUSION - SIMPLE MED: 62.5000 mL/h | INTRAVENOUS | Status: AC

## 2012-08-08 MED ORDER — FENTANYL CITRATE 0.05 MG/ML IJ SOLN
INTRAMUSCULAR | Status: AC
  Filled 2012-08-08: qty 2

## 2012-08-08 MED ORDER — LACTATED RINGERS IV SOLN
INTRAVENOUS | Status: DC
  Administered 2012-08-08: 15:00:00 via INTRAVENOUS

## 2012-08-08 MED ORDER — DIPHENHYDRAMINE HCL 50 MG/ML IJ SOLN: 25.0000 mg | INTRAMUSCULAR | Status: DC | PRN

## 2012-08-08 MED ORDER — KETOROLAC TROMETHAMINE 30 MG/ML IJ SOLN
INTRAMUSCULAR | Status: AC
  Filled 2012-08-08: qty 1

## 2012-08-08 MED ORDER — MEPERIDINE HCL 25 MG/ML IJ SOLN: 6.2500 mg | INTRAMUSCULAR | Status: DC | PRN

## 2012-08-08 MED ORDER — ONDANSETRON HCL 4 MG/2ML IJ SOLN: 4.0000 mg | INTRAMUSCULAR | Status: DC | PRN

## 2012-08-08 MED ORDER — OXYCODONE-ACETAMINOPHEN 5-325 MG PO TABS
1.0000 | ORAL_TABLET | ORAL | Status: DC | PRN
  Administered 2012-08-08: 1 via ORAL
  Administered 2012-08-09 (×3): 2 via ORAL
  Administered 2012-08-09: 1 via ORAL
  Administered 2012-08-10 (×2): 2 via ORAL
  Filled 2012-08-08: qty 1
  Filled 2012-08-08 (×4): qty 2
  Filled 2012-08-08: qty 1
  Filled 2012-08-08 (×2): qty 2

## 2012-08-08 MED ORDER — ONDANSETRON HCL 4 MG/2ML IJ SOLN
INTRAMUSCULAR | Status: AC
  Filled 2012-08-08: qty 2

## 2012-08-08 MED ORDER — DIPHENHYDRAMINE HCL 50 MG/ML IJ SOLN: 12.5000 mg | INTRAMUSCULAR | Status: DC | PRN

## 2012-08-08 MED ORDER — LANOLIN HYDROUS EX OINT: 1.0000 "application " | TOPICAL_OINTMENT | CUTANEOUS | Status: DC | PRN

## 2012-08-08 MED ORDER — KETOROLAC TROMETHAMINE 30 MG/ML IJ SOLN
30.0000 mg | Freq: Four times a day (QID) | INTRAMUSCULAR | Status: DC | PRN
  Administered 2012-08-08: 30 mg via INTRAVENOUS

## 2012-08-08 MED ORDER — DIBUCAINE 1 % RE OINT: 1.0000 "application " | TOPICAL_OINTMENT | RECTAL | Status: DC | PRN

## 2012-08-08 MED ORDER — SIMETHICONE 80 MG PO CHEW: 80.0000 mg | CHEWABLE_TABLET | ORAL | Status: DC | PRN

## 2012-08-08 MED ORDER — EPHEDRINE 5 MG/ML INJ
INTRAVENOUS | Status: AC
  Filled 2012-08-08: qty 20

## 2012-08-08 MED ORDER — ONDANSETRON HCL 4 MG/2ML IJ SOLN: 4.0000 mg | Freq: Three times a day (TID) | INTRAMUSCULAR | Status: DC | PRN

## 2012-08-08 MED ORDER — OXYTOCIN 10 UNIT/ML IJ SOLN
40.0000 [IU] | INTRAVENOUS | Status: DC | PRN
  Administered 2012-08-08: 40 [IU] via INTRAVENOUS

## 2012-08-08 MED ORDER — SIMETHICONE 80 MG PO CHEW
80.0000 mg | CHEWABLE_TABLET | Freq: Three times a day (TID) | ORAL | Status: DC
  Administered 2012-08-08 – 2012-08-10 (×5): 80 mg via ORAL

## 2012-08-08 MED ORDER — IBUPROFEN 600 MG PO TABS
600.0000 mg | ORAL_TABLET | Freq: Four times a day (QID) | ORAL | Status: DC
  Administered 2012-08-08 – 2012-08-10 (×8): 600 mg via ORAL
  Filled 2012-08-08 (×4): qty 1

## 2012-08-08 MED ORDER — ONDANSETRON HCL 4 MG/2ML IJ SOLN
INTRAMUSCULAR | Status: DC | PRN
  Administered 2012-08-08: 4 mg via INTRAVENOUS

## 2012-08-08 MED ORDER — IBUPROFEN 600 MG PO TABS
600.0000 mg | ORAL_TABLET | Freq: Four times a day (QID) | ORAL | Status: DC | PRN
  Filled 2012-08-08 (×4): qty 1

## 2012-08-08 MED ORDER — LACTATED RINGERS IV SOLN
Freq: Once | INTRAVENOUS | Status: AC
  Administered 2012-08-08: 09:00:00 via INTRAVENOUS

## 2012-08-08 MED ORDER — FENTANYL CITRATE 0.05 MG/ML IJ SOLN: 25.0000 ug | INTRAMUSCULAR | Status: DC | PRN

## 2012-08-08 MED ORDER — SODIUM CHLORIDE 0.9 % IJ SOLN: 3.0000 mL | INTRAMUSCULAR | Status: DC | PRN

## 2012-08-08 MED ORDER — SCOPOLAMINE 1 MG/3DAYS TD PT72
1.0000 | MEDICATED_PATCH | Freq: Once | TRANSDERMAL | Status: DC
  Administered 2012-08-08: 1.5 mg via TRANSDERMAL

## 2012-08-08 MED ORDER — MORPHINE SULFATE 0.5 MG/ML IJ SOLN
INTRAMUSCULAR | Status: AC
  Filled 2012-08-08: qty 10

## 2012-08-08 MED ORDER — FENTANYL CITRATE 0.05 MG/ML IJ SOLN
INTRAMUSCULAR | Status: DC | PRN
  Administered 2012-08-08: 25 ug via INTRATHECAL

## 2012-08-08 MED ORDER — PRENATAL MULTIVITAMIN CH
1.0000 | ORAL_TABLET | Freq: Every day | ORAL | Status: DC
  Administered 2012-08-09 – 2012-08-10 (×2): 1 via ORAL
  Filled 2012-08-08 (×2): qty 1

## 2012-08-08 MED ORDER — SENNOSIDES-DOCUSATE SODIUM 8.6-50 MG PO TABS
2.0000 | ORAL_TABLET | Freq: Every day | ORAL | Status: DC
  Administered 2012-08-08 – 2012-08-09 (×2): 2 via ORAL

## 2012-08-08 MED ORDER — KETOROLAC TROMETHAMINE 30 MG/ML IJ SOLN: 30.0000 mg | Freq: Four times a day (QID) | INTRAMUSCULAR | Status: DC | PRN

## 2012-08-08 SURGICAL SUPPLY — 43 items
BENZOIN TINCTURE PRP APPL 2/3 (GAUZE/BANDAGES/DRESSINGS) ×2 IMPLANT
CLIP FILSHIE TUBAL LIGA STRL (Clip) ×2 IMPLANT
CLOTH BEACON ORANGE TIMEOUT ST (SAFETY) ×2 IMPLANT
DRAPE INCISE 13X13 STRL (DRAPES) IMPLANT
DRAPE LG THREE QUARTER DISP (DRAPES) ×2 IMPLANT
DRSG OPSITE POSTOP 4X10 (GAUZE/BANDAGES/DRESSINGS) ×2 IMPLANT
DURAPREP 26ML APPLICATOR (WOUND CARE) ×2 IMPLANT
ELECT REM PT RETURN 9FT ADLT (ELECTROSURGICAL) ×2
ELECTRODE REM PT RTRN 9FT ADLT (ELECTROSURGICAL) ×1 IMPLANT
EXTRACTOR VACUUM KIWI (MISCELLANEOUS) ×2 IMPLANT
GLOVE BIO SURGEON ST LM GN SZ9 (GLOVE) ×2 IMPLANT
GLOVE BIO SURGEON STRL SZ 6.5 (GLOVE) ×2 IMPLANT
GLOVE BIO SURGEON STRL SZ7 (GLOVE) ×2 IMPLANT
GLOVE BIOGEL PI IND STRL 6.5 (GLOVE) ×1 IMPLANT
GLOVE BIOGEL PI IND STRL 7.0 (GLOVE) ×2 IMPLANT
GLOVE BIOGEL PI IND STRL 9 (GLOVE) ×2 IMPLANT
GLOVE BIOGEL PI INDICATOR 6.5 (GLOVE) ×1
GLOVE BIOGEL PI INDICATOR 7.0 (GLOVE) ×2
GLOVE BIOGEL PI INDICATOR 9 (GLOVE) ×2
GOWN PREVENTION PLUS LG XLONG (DISPOSABLE) ×4 IMPLANT
GOWN PREVENTION PLUS XXLARGE (GOWN DISPOSABLE) ×2 IMPLANT
GOWN STRL REIN 3XL LVL4 (GOWN DISPOSABLE) ×2 IMPLANT
NEEDLE HYPO 25X5/8 SAFETYGLIDE (NEEDLE) IMPLANT
NS IRRIG 1000ML POUR BTL (IV SOLUTION) ×2 IMPLANT
PACK C SECTION WH (CUSTOM PROCEDURE TRAY) ×2 IMPLANT
PAD OB MATERNITY 4.3X12.25 (PERSONAL CARE ITEMS) ×2 IMPLANT
RETRACTOR WND ALEXIS 25 LRG (MISCELLANEOUS) ×1 IMPLANT
RTRCTR C-SECT PINK 25CM LRG (MISCELLANEOUS) IMPLANT
RTRCTR WOUND ALEXIS 25CM LRG (MISCELLANEOUS) ×2
SLEEVE SCD COMPRESS KNEE MED (MISCELLANEOUS) IMPLANT
STRIP CLOSURE SKIN 1/2X4 (GAUZE/BANDAGES/DRESSINGS) ×2 IMPLANT
SUT CHROMIC 0 CTX 36 (SUTURE) ×4 IMPLANT
SUT PLAIN 0 NONE (SUTURE) ×2 IMPLANT
SUT PLAIN 2 0 XLH (SUTURE) ×2 IMPLANT
SUT VIC AB 0 CT1 27 (SUTURE) ×1
SUT VIC AB 0 CT1 27XBRD ANBCTR (SUTURE) ×1 IMPLANT
SUT VIC AB 2-0 CT1 27 (SUTURE) ×2
SUT VIC AB 2-0 CT1 TAPERPNT 27 (SUTURE) ×2 IMPLANT
SUT VIC AB 4-0 KS 27 (SUTURE) ×2 IMPLANT
SYR BULB IRRIGATION 50ML (SYRINGE) IMPLANT
TOWEL OR 17X24 6PK STRL BLUE (TOWEL DISPOSABLE) ×2 IMPLANT
TRAY FOLEY CATH 14FR (SET/KITS/TRAYS/PACK) ×2 IMPLANT
WATER STERILE IRR 1000ML POUR (IV SOLUTION) ×2 IMPLANT

## 2012-08-08 NOTE — Anesthesia Preprocedure Evaluation (Addendum)
Anesthesia Evaluation  Patient identified by MRN, date of birth, ID band Patient awake    Reviewed: Allergy & Precautions, H&P , NPO status , Patient's Chart, lab work & pertinent test results  Airway Mallampati: III TM Distance: >3 FB Neck ROM: Full    Dental  (+) Poor Dentition   Pulmonary asthma , Current Smoker,  breath sounds clear to auscultation  Pulmonary exam normal       Cardiovascular negative cardio ROS  Rhythm:Regular Rate:Normal     Neuro/Psych FSHD (FacioScapulohumeral Muscular Dystrophy)  Neuromuscular disease negative psych ROS   GI/Hepatic Neg liver ROS, GERD-  Medicated and Controlled,  Endo/Other  negative endocrine ROS  Renal/GU negative Renal ROS  negative genitourinary   Musculoskeletal   Abdominal (+) + obese,   Peds  Hematology negative hematology ROS (+)   Anesthesia Other Findings   Reproductive/Obstetrics (+) Pregnancy Previous C/Section Desires Sterilization                         Anesthesia Physical Anesthesia Plan  ASA: II  Anesthesia Plan: Spinal   Post-op Pain Management:    Induction:   Airway Management Planned: Natural Airway  Additional Equipment:   Intra-op Plan:   Post-operative Plan:   Informed Consent: I have reviewed the patients History and Physical, chart, labs and discussed the procedure including the risks, benefits and alternatives for the proposed anesthesia with the patient or authorized representative who has indicated his/her understanding and acceptance.   Dental advisory given  Plan Discussed with: Anesthesiologist, CRNA and Surgeon  Anesthesia Plan Comments:         Anesthesia Quick Evaluation

## 2012-08-08 NOTE — Progress Notes (Signed)
UR chart review completed.  

## 2012-08-08 NOTE — Transfer of Care (Signed)
Immediate Anesthesia Transfer of Care Note  Patient: [name] Hampton  Procedure(s) Performed: Procedure(s) (LRB) with comments: CESAREAN SECTION WITH BILATERAL TUBAL LIGATION (N/A) - Repeat  Patient Location: PACU  Anesthesia Type:Spinal  Level of Consciousness: awake, alert  and oriented  Airway & Oxygen Therapy: Patient Spontanous Breathing  Post-op Assessment: Report given to PACU RN and Post -op Vital signs reviewed and stable  Post vital signs: Reviewed and stable  Complications: No apparent anesthesia complications

## 2012-08-08 NOTE — Brief Op Note (Addendum)
08/08/2012  11:21 AM  PATIENT:  [name] Hampton  32 y.o. female  PRE-OPERATIVE DIAGNOSIS:  Previous Cesarean; Desires Sterilization; EDC 2/5 preg 39 weeks   POST-OPERATIVE DIAGNOSIS:  Previous Cesarean; Desires Sterilization; EDC 2/5 preg 39 weeks with left broad ligament extension of bladder flap resulting in a window, repaired  PROCEDURE:  Procedure(s) (LRB) with comments: CESAREAN SECTION WITH BILATERAL TUBAL LIGATION (N/A) - Repeat Repair of left broad ligament window SURGEON:  Surgeon(s) and Role:    * Tilda Burrow, MD - Primary  PHYSICIAN ASSISTANT: Thad Ranger MD  ASSISTANTS: none   ANESTHESIA:   spinal  EBL:  Total I/O In: 3200 [I.V.:3200] Out: 950 [Urine:150; Blood:800]  BLOOD ADMINISTERED:none  DRAINS: Urinary Catheter (Foley)   LOCAL MEDICATIONS USED:  NONE  SPECIMEN:  Source of Specimen:  placenta to L&D  DISPOSITION OF SPECIMEN:  L&D  COUNTS:  YES  TOURNIQUET:  * No tourniquets in log *  DICTATION: .Dragon Dictation Patient was taken to the operating room prepped and draped for lower abdominal surgery. Timeout was conducted and Ancef 2 g administered Pfannenstiel type incision was repeated with excision of the old cicatrix. The fascia was quite fibrously attached to the rectus muscles were sharply dissected free to 14 cm window into the abdominal cavity was developed through midline incision was developed and transverse uterine incision made extended laterally with with index finger traction with extension more to the left than the right. Fetal vertex was rotated into the incision, and after fundal pressure and manual guidance did not deliver the head, Kiwi vacuum extractor was placed on the occiput and the baby delivered easily. The shoulders were a tight fit, and responded to axillary traction first the anterior shoulder then the left, being careful to avoid significant amount of traction on the head. Cord is clamped and the infant passed to the pediatricians see  their notes for details.  Placenta was delivered and passed to the cord blood bank collection agent for cord blood collection. The uterine incision was inspected, the uterus irrigated and the tearing on the left side reached almost to the uterine vessels. With careful attention to the identification of the uterine vessels, we placed a  moistened laparotomy tape time the left broad ligament to keep the bowel out of the way and then closed the uterine incision with a single layer running locking closure, with the left side requiring a Y. shaped  closure, to achieve hemostasis. Tubal ligation. The fallopian tubes were identified to their fimbriated end, and a posterior clip applied to the midportion of each fallopian tube with good secure placement.  Laparotomy tape was removed, and it was identified that a small window in the left round ligament, 3 cm, there was located  medial to the round ligament.. This was repaired using interrupted 2-0 Vicryl sutures x3. An Alexis wound retractor had been placed to improve visibility. Alexis wound retractor was then removed, anterior peritoneum closed using running 2-0 Vicryl, the fascia was closed using running 0 Vicryl the subcutaneous tissues reapproximated using interrupted 2-0 Vicryl and subcuticular 4-0 Vicryl used to close the skin incision. Estimated blood loss 800 cc, primarily attributable the bleeding from the left corner of the uterine incision. Sponge and needle counts correct. PLAN OF CARE: Admit to inpatient   PATIENT DISPOSITION:  PACU - hemodynamically stable.   Delay start of Pharmacological VTE agent (>24hrs) due to surgical blood loss or risk of bleeding: not applicable

## 2012-08-08 NOTE — Op Note (Signed)
See operative note details included in the brief operative note

## 2012-08-08 NOTE — Progress Notes (Signed)
Patient ID: [name] Hampton, female   DOB: [birth year], 31 y.o.   MRN: [id number]   S:  Called by RN on mother-baby for bleeding at incision. Some pooling of blood in honeycomb dressing that has gradually worsened since transfer from PACU.  O:   Filed Vitals:   08/08/12 1230  BP: 128/77  Pulse: 96  Temp: 98.6 F (37 C)  Resp: 22    Honeycomb saturated mainly on R side of incision with bright red blood. Removed honey-comb. Steri-strips in place. No bleeding with palpation of abdomen above incision. Incision intact. Non-adherent pad placed with honeycomb on top with added pressure. Will recheck in one hour. If continues to bleed, will put additional pressure dressing.  Napoleon Form, MD 08/08/2012 1:03 PM

## 2012-08-08 NOTE — Anesthesia Procedure Notes (Signed)
Spinal  Patient location during procedure: OR Start time: 08/08/2012 9:23 AM Staffing Anesthesiologist: Sparsh Callens A. Performed by: anesthesiologist  Preanesthetic Checklist Completed: patient identified, site marked, surgical consent, pre-op evaluation, timeout performed, IV checked, risks and benefits discussed and monitors and equipment checked Spinal Block Patient position: sitting Prep: site prepped and draped and DuraPrep Patient monitoring: heart rate, cardiac monitor, continuous pulse ox and blood pressure Approach: midline Location: L3-4 Injection technique: single-shot Needle Needle type: Sprotte  Needle gauge: 24 G Needle length: 9 cm Needle insertion depth: 5 cm Assessment Sensory level: T4 Additional Notes Patient tolerated procedure well. Adequate sensory level.

## 2012-08-08 NOTE — Anesthesia Postprocedure Evaluation (Signed)
Anesthesia Post Note  Patient: [name] Hampton  Procedure(s) Performed: Procedure(s) (LRB): CESAREAN SECTION WITH BILATERAL TUBAL LIGATION (N/A)  Anesthesia type: Spinal  Patient location: PACU  Post pain: Pain level controlled  Post assessment: Post-op Vital signs reviewed  Last Vitals:  Filed Vitals:   08/08/12 1115  BP: 110/78  Pulse: 89  Temp:   Resp: 19    Post vital signs: Reviewed  Level of consciousness: awake  Complications: No apparent anesthesia complications

## 2012-08-09 ENCOUNTER — Encounter (HOSPITAL_COMMUNITY): Payer: Self-pay | Admitting: Obstetrics and Gynecology

## 2012-08-09 DIAGNOSIS — G930 Cerebral cysts: Secondary | ICD-10-CM

## 2012-08-09 DIAGNOSIS — IMO0002 Reserved for concepts with insufficient information to code with codable children: Secondary | ICD-10-CM

## 2012-08-09 DIAGNOSIS — O34219 Maternal care for unspecified type scar from previous cesarean delivery: Secondary | ICD-10-CM

## 2012-08-09 LAB — CBC
MCH: 26.4 pg (ref 26.0–34.0)
MCHC: 32.7 g/dL (ref 30.0–36.0)
MCV: 80.6 fL (ref 78.0–100.0)
Platelets: 347 10*3/uL (ref 150–400)
RBC: 3.3 MIL/uL — ABNORMAL LOW (ref 3.87–5.11)

## 2012-08-09 NOTE — Addendum Note (Signed)
Addendum  created 08/09/12 0748 by Lincoln Brigham, CRNA   Modules edited:Notes Section

## 2012-08-09 NOTE — Anesthesia Postprocedure Evaluation (Signed)
  Anesthesia Post-op Note  Patient: [name] Hampton  Procedure(s) Performed: Procedure(s) (LRB) with comments: CESAREAN SECTION WITH BILATERAL TUBAL LIGATION (N/A) - Repeat  Patient Location: PACU  Anesthesia Type:Spinal  Level of Consciousness: awake, alert  and oriented  Airway and Oxygen Therapy: Patient Spontanous Breathing  Post-op Pain: mild  Post-op Assessment: Patient's Cardiovascular Status Stable, Respiratory Function Stable, Patent Airway, No signs of Nausea or vomiting and Pain level controlled  Post-op Vital Signs: stable  Complications: No apparent anesthesia complications

## 2012-08-09 NOTE — Progress Notes (Signed)
Subjective: Postpartum Day 1: Cesarean Delivery (planned, repeat with BTL) Patient reports tolerating PO, + flatus, + BM and no problems voiding.    Objective: Vital signs in last 24 hours: Temp:  [97.5 F (36.4 C)-98.7 F (37.1 C)] 97.5 F (36.4 C) (01/31 0626) Pulse Rate:  [74-104] 74  (01/31 0626) Resp:  [16-22] 20  (01/31 0626) BP: (106-128)/(61-82) 111/73 mmHg (01/31 0626) SpO2:  [97 %-100 %] 100 % (01/30 2130) Weight:  [79.379 kg (175 lb)] 79.379 kg (175 lb) (01/30 1336)  Physical Exam:  General: alert, cooperative, appears stated age and no distress Lochia: appropriate Uterine Fundus: firm Incision: healing well, no significant drainage, no dehiscence, no significant erythema; no further bleeding since yesterday afternoon DVT Evaluation: No evidence of DVT seen on physical exam.   Basename 08/09/12 0525  HGB 8.7*  HCT 26.6*    Assessment/Plan: Status post Cesarean section. Doing well postoperatively.  Continue current care.  Lactation to see mom. S/p BTL. Continue to monitor for incisional bleeding, appears to have resolved since yesterday.   [name] Hampton 08/09/2012, 6:56 AM

## 2012-08-10 MED ORDER — OXYCODONE-ACETAMINOPHEN 5-325 MG PO TABS: 1.0000 | ORAL_TABLET | ORAL | Status: DC | PRN

## 2012-08-10 MED ORDER — MEASLES, MUMPS & RUBELLA VAC ~~LOC~~ INJ
0.5000 mL | INJECTION | Freq: Once | SUBCUTANEOUS | Status: AC
  Administered 2012-08-10: 0.5 mL via SUBCUTANEOUS
  Filled 2012-08-10: qty 0.5

## 2012-08-10 MED ORDER — IBUPROFEN 600 MG PO TABS: 600.0000 mg | ORAL_TABLET | Freq: Four times a day (QID) | ORAL | Status: DC | PRN

## 2012-08-10 NOTE — Progress Notes (Signed)
I saw and examined patient and agree with above note.  Napoleon Form, MD

## 2012-08-10 NOTE — Discharge Summary (Signed)
Obstetric Discharge Summary Reason for Admission: cesarean section and elective sterilization Prenatal Procedures: none Intrapartum Procedures: spontaneous vaginal delivery Postpartum Procedures: none Complications-Operative and Postpartum: none Hemoglobin  Date Value Range Status  08/09/2012 8.7* 12.0 - 15.0 g/dL Final     HCT  Date Value Range Status  08/09/2012 26.6* 36.0 - 46.0 % Final   Pt had regular prenatal care at [facility] and presented at 39.1wks for scheduled repeat C/S and BTL.  Physical Exam:  General: alert, cooperative and mild distress Lochia: appropriate Uterine Fundus: firm Incision: healing well, no significant erythema, staining but unchanged DVT Evaluation: No evidence of DVT seen on physical exam.  Discharge Diagnoses: Term Pregnancy-delivered and BTL  Discharge Information: Date: 08/10/2012 Activity: pelvic rest Diet: routine Medications: PNV, Ibuprofen and Percocet Condition: stable Instructions: refer to practice specific booklet Discharge to: home Follow-up Information    Follow up with [facility]. (Keep scheduled postpartum appointment)    Contact information:   [address] [place] [address] [phone]         Newborn Data: Live born female  Birth Weight: 8 lb 13.3 oz (4005 g) APGAR: 7, 8  Home with mother. Breastfeeding going well.  Cam Hai 08/10/2012, 7:49 AM

## 2012-08-11 LAB — TYPE AND SCREEN
Antibody Screen: NEGATIVE
Unit division: 0
Unit division: 0

## 2012-08-12 NOTE — Discharge Summary (Signed)
Attestation of Attending Supervision of Advanced Practitioner (CNM/NP): Evaluation and management procedures were performed by the Advanced Practitioner under my supervision and collaboration.  I have reviewed the Advanced Practitioner's note and chart, and I agree with the management and plan.  Myeshia Fojtik 08/12/2012 8:54 AM   

## 2012-08-24 ENCOUNTER — Other Ambulatory Visit: Payer: Self-pay

## 2012-09-26 ENCOUNTER — Ambulatory Visit (INDEPENDENT_AMBULATORY_CARE_PROVIDER_SITE_OTHER): Payer: Medicaid Other | Admitting: Obstetrics and Gynecology

## 2012-09-26 ENCOUNTER — Encounter: Payer: Self-pay | Admitting: Obstetrics and Gynecology

## 2012-09-26 VITALS — BP 120/78 | Wt 155.2 lb

## 2012-09-26 DIAGNOSIS — N946 Dysmenorrhea, unspecified: Secondary | ICD-10-CM | POA: Insufficient documentation

## 2012-09-26 NOTE — Progress Notes (Signed)
Subjective:   discussion of Endometrial Ablation  [name] Hampton is a 32 y.o. female who presents for  Visit to discuss heavy menses, a lifelong challenge. She is 7 weeks postpartum following a low cervical transverse Cesarean section. I have fully reviewed the prenatal and intrapartum course. The delivery was at 39 gestational weeks. Outcome: repeat cesarean section, low transverse incision. Anesthesia:spinal spinal. Postpartum course has been uncomplicated. Baby's course has been stable. Baby is feeding by breast. Bleeding moderate lochia. Bowel function is normal. Bladder function is normal. Patient is sexually active. Contraception method is condoms and tubal. Postpartum depression screening: negative.  Patient desires to get endo ablation due to lifelong debilitating menses  Review of Systems Pertinent items are noted in HPI.   Objective:    BP 120/78  Wt 155 lb 3.2 oz (70.398 kg)  BMI 28.85 kg/m2  Breastfeeding? No  General:  alert and no distress   Breasts:  negative  Lungs: clear to auscultation bilaterally  Heart:  regular rate and rhythm  Abdomen: soft, non-tender; bowel sounds normal; no masses,  no organomegaly   Vulva:  normal  Vagina: normal vagina  Cervix:  anteverted  Corpus: enlarged, 6 weeks size, anteverted and anteflexed  Adnexa:  normal adnexa  Rectal Exam: Normal rectovaginal exam        Assessment:  History of severe menorrhagia   uneventful postpartum exam..   Plan:    1. Contraception: tubal ligation 2. Desires Ablation      Will pursue ablation at [facility] before end of APril. 3. Follow up in: 3 weeks or as needed.

## 2012-09-26 NOTE — Patient Instructions (Signed)
Please contact [name] Hampton at our office regarding scheduling of Ablation in mid April

## 2012-09-27 ENCOUNTER — Encounter (HOSPITAL_COMMUNITY): Payer: Self-pay | Admitting: Pharmacy Technician

## 2012-10-01 NOTE — Patient Instructions (Signed)
[name] Hampton  10/01/2012   Your procedure is scheduled on:  10/08/12  Report to [facility] at 08:00 AM.  Call this number if you have problems the morning of surgery: [phone]   Remember:   Do not eat food or drink liquids after midnight.   Take these medicines the morning of surgery with A SIP OF WATER: Protonix.   Do not wear jewelry, make-up or nail polish.  Do not wear lotions, powders, or perfumes.   Do not shave 48 hours prior to surgery. Men may shave face and neck.  Do not bring valuables to the hospital.  Contacts, dentures or bridgework may not be worn into surgery.  Leave suitcase in the car. After surgery it may be brought to your room.  For patients admitted to the hospital, checkout time is 11:00 AM the day of  discharge.   Patients discharged the day of surgery will not be allowed to drive  home.    Special Instructions: Shower using CHG 2 nights before surgery and the night before surgery.  If you shower the day of surgery use CHG.  Use special wash - you have one bottle of CHG for all showers.  You should use approximately 1/3 of the bottle for each shower.   Please read over the following fact sheets that you were given: Pain Booklet, MRSA Information, Surgical Site Infection Prevention, Anesthesia Post-op Instructions and Care and Recovery After Surgery    Endometrial Ablation Endometrial ablation removes the lining of the uterus (endometrium). It is usually a same day, outpatient treatment. Ablation helps avoid major surgery (such as a hysterectomy). A hysterectomy is removal of the cervix and uterus. Endometrial ablation has less risk and complications, has a shorter recovery period and is less expensive. After endometrial ablation, most women will have little or no menstrual bleeding. You may not keep your fertility. Pregnancy is no longer likely after this procedure but if you are pre-menopausal, you still need to use a reliable method of birth control following the  procedure because pregnancy can occur. REASONS TO HAVE THE PROCEDURE MAY INCLUDE:  Heavy periods.  Bleeding that is causing anemia.  Anovulatory bleeding, very irregular, bleeding.  Bleeding submucous fibroids (on the lining inside the uterus) if they are smaller than 3 centimeters. REASONS NOT TO HAVE THE PROCEDURE MAY INCLUDE:  You wish to have more children.  You have a pre-cancerous or cancerous problem. The cause of any abnormal bleeding must be diagnosed before having the procedure.  You have pain coming from the uterus.  You have a submucus fibroid larger than 3 centimeters.  You recently had a baby.  You recently had an infection in the uterus.  You have a severe retro-flexed, tipped uterus and cannot insert the instrument to do the ablation.  You had a Cesarean section or deep major surgery on the uterus.  The inner cavity of the uterus is too large for the endometrial ablation instrument. RISKS AND COMPLICATIONS   Perforation of the uterus.  Bleeding.  Infection of the uterus, bladder or vagina.  Injury to surrounding organs.  Cutting the cervix.  An air bubble to the lung (air embolus).  Pregnancy following the procedure.  Failure of the procedure to help the problem requiring hysterectomy.  Decreased ability to diagnose cancer in the lining of the uterus. BEFORE THE PROCEDURE  The lining of the uterus must be tested to make sure there is no pre-cancerous or cancer cells present.  Medications may be given  to make the lining of the uterus thinner.  Ultrasound may be used to evaluate the size and look for abnormalities of the uterus.  Future pregnancy is not desired. PROCEDURE  There are different ways to destroy the lining of the uterus.   Resectoscope - radio frequency-alternating electric current is the most common one used.  Cryotherapy - freezing the lining of the uterus.  Heated Free Liquid - heated salt (saline) solution inserted into  the uterus.  Microwave - uses high energy microwaves in the uterus.  Thermal Balloon - a catheter with a balloon tip is inserted into the uterus and filled with heated fluid. Your caregiver will talk with you about the method used in this clinic. They will also instruct you on the pros and cons of the procedure. Endometrial ablation is performed along with a procedure called operative hysteroscopy. A narrow viewing tube is inserted through the birth canal (vagina) and through the cervix into the uterus. A tiny camera attached to the viewing tube (hysteroscope) allows the uterine cavity to be shown on a TV monitor during surgery. Your uterus is filled with a harmless liquid to make the procedure easier. The lining of the uterus is then removed. The lining can also be removed with a resectoscope which allows your surgeon to cut away the lining of the uterus under direct vision. Usually, you will be able to go home within an hour after the procedure. HOME CARE INSTRUCTIONS   Do not drive for 24 hours.  No tampons, douching or intercourse for 2 weeks or until your caregiver approves.  Rest at home for 24 to 48 hours. You may then resume normal activities unless told differently by your caregiver.  Take your temperature two times a day for 4 days, and record it.  Take any medications your caregiver has ordered, as directed.  Use some form of contraception if you are pre-menopausal and do not want to get pregnant. Bleeding after the procedure is normal. It varies from light spotting and mildly watery to bloody discharge for 4 to 6 weeks. You may also have mild cramping. Only take over-the-counter or prescription medicines for pain, discomfort, or fever as directed by your caregiver. Do not use aspirin, as this may aggravate bleeding. Frequent urination during the first 24 hours is normal. You will not know how effective your surgery is until at least 3 months after the surgery. SEEK IMMEDIATE MEDICAL  CARE IF:   Bleeding is heavier than a normal menstrual cycle.  An oral temperature above 102 F (38.9 C) develops.  You have increasing cramps or pains not relieved with medication or develop belly (abdominal) pain which does not seem to be related to the same area of earlier cramping and pain.  You are light headed, weak or have fainting episodes.  You develop pain in the shoulder strap areas.  You have chest or leg pain.  You have abnormal vaginal discharge.  You have painful urination. Document Released: 05/05/2004 Document Revised: 09/18/2011 Document Reviewed: 08/03/2007 [facility] Patient Information 2013 [place], [place].    PATIENT INSTRUCTIONS POST-ANESTHESIA  IMMEDIATELY FOLLOWING SURGERY:  Do not drive or operate machinery for the first twenty four hours after surgery.  Do not make any important decisions for twenty four hours after surgery or while taking narcotic pain medications or sedatives.  If you develop intractable nausea and vomiting or a severe headache please notify your doctor immediately.  FOLLOW-UP:  Please make an appointment with your surgeon as instructed. You do not  need to follow up with anesthesia unless specifically instructed to do so.  WOUND CARE INSTRUCTIONS (if applicable):  Keep a dry clean dressing on the anesthesia/puncture wound site if there is drainage.  Once the wound has quit draining you may leave it open to air.  Generally you should leave the bandage intact for twenty four hours unless there is drainage.  If the epidural site drains for more than 36-48 hours please call the anesthesia department.  QUESTIONS?:  Please feel free to call your physician or the hospital operator if you have any questions, and they will be happy to assist you.

## 2012-10-02 ENCOUNTER — Encounter (HOSPITAL_COMMUNITY)
Admission: RE | Admit: 2012-10-02 | Discharge: 2012-10-02 | Disposition: A | Discharge status: Home / Self Care | Payer: Medicaid Other | Source: Ambulatory Visit | Attending: Obstetrics and Gynecology | Admitting: Obstetrics and Gynecology

## 2012-10-02 ENCOUNTER — Encounter (HOSPITAL_COMMUNITY): Payer: Self-pay

## 2012-10-02 HISTORY — DX: Hypoglycemia, unspecified: E16.2

## 2012-10-02 LAB — CBC
Hemoglobin: 12.2 g/dL (ref 12.0–15.0)
MCH: 26 pg (ref 26.0–34.0)
MCHC: 33.4 g/dL (ref 30.0–36.0)
MCV: 77.7 fL — ABNORMAL LOW (ref 78.0–100.0)
Platelets: 301 10*3/uL (ref 150–400)
RBC: 4.7 MIL/uL (ref 3.87–5.11)

## 2012-10-02 LAB — URINALYSIS, ROUTINE W REFLEX MICROSCOPIC
Bilirubin Urine: NEGATIVE
Hgb urine dipstick: NEGATIVE
Specific Gravity, Urine: 1.025 (ref 1.005–1.030)
pH: 5.5 (ref 5.0–8.0)

## 2012-10-02 NOTE — H&P (Signed)
[name] Hampton is a 32 y.o. female who presents for Visit to discuss heavy menses, a lifelong challenge. She is 7 weeks postpartum following a low cervical transverse Cesarean section. I have fully reviewed the prenatal and intrapartum course. The delivery was at 39 gestational weeks. Outcome: repeat cesarean section, low transverse incision. Anesthesia:spinal spinal. Postpartum course has been uncomplicated. Baby's course has been stable. Baby is feeding by breast. Bleeding moderate lochia. Bowel function is normal. Bladder function is normal. Patient is sexually active. Contraception method is condoms and tubal. Postpartum depression screening: negative.  Patient desires to get endo ablation due to lifelong debilitating menses  Review of Systems  Pertinent items are noted in HPI.  Objective:   BP 120/78  Wt 155 lb 3.2 oz (70.398 kg)  BMI 28.85 kg/m2  Breastfeeding? No  General:  alert and no distress   Breasts:  negative   Lungs:  clear to auscultation bilaterally   Heart:  regular rate and rhythm   Abdomen:  soft, non-tender; bowel sounds normal; no masses, no organomegaly   Vulva:  normal   Vagina:  normal vagina   Cervix:  anteverted   Corpus:  enlarged, 6 weeks size, anteverted and anteflexed   Adnexa:  normal adnexa   Rectal Exam:  Normal rectovaginal exam    Assessment:   History of severe menorrhagia  uneventful postpartum exam..  Desire for Endometrial Ablation Plan: 1. Scheduled for Hysteroscopy, D&C, Endometrial Ablation on Tuesday April 1 , 2014 2  Review of op note from cesarean,shows a y-shaped closure of the uterine incision at cesarean closure ; will make particular note of incision integrity during hysteroscopy prior to ablation procedure.

## 2012-10-08 ENCOUNTER — Ambulatory Visit (HOSPITAL_COMMUNITY)
Admission: RE | Admit: 2012-10-08 | Discharge: 2012-10-08 | Disposition: A | Discharge status: Home / Self Care | Payer: Medicaid Other | Source: Ambulatory Visit | Attending: Obstetrics and Gynecology | Admitting: Obstetrics and Gynecology

## 2012-10-08 ENCOUNTER — Ambulatory Visit (HOSPITAL_COMMUNITY): Payer: Medicaid Other | Admitting: Anesthesiology

## 2012-10-08 ENCOUNTER — Encounter (HOSPITAL_COMMUNITY): Payer: Self-pay | Admitting: Anesthesiology

## 2012-10-08 ENCOUNTER — Encounter (HOSPITAL_COMMUNITY)
Admission: RE | Disposition: A | Discharge status: Home / Self Care | Payer: Self-pay | Source: Ambulatory Visit | Attending: Obstetrics and Gynecology

## 2012-10-08 ENCOUNTER — Encounter (HOSPITAL_COMMUNITY): Payer: Self-pay | Admitting: *Deleted

## 2012-10-08 DIAGNOSIS — N920 Excessive and frequent menstruation with regular cycle: Secondary | ICD-10-CM | POA: Insufficient documentation

## 2012-10-08 DIAGNOSIS — N921 Excessive and frequent menstruation with irregular cycle: Secondary | ICD-10-CM

## 2012-10-08 HISTORY — PX: DILITATION & CURRETTAGE/HYSTROSCOPY WITH THERMACHOICE ABLATION: SHX5569

## 2012-10-08 SURGERY — DILATATION & CURETTAGE/HYSTEROSCOPY WITH THERMACHOICE ABLATION
Anesthesia: General | Site: Uterus | Wound class: Clean Contaminated

## 2012-10-08 MED ORDER — MIDAZOLAM HCL 2 MG/2ML IJ SOLN
INTRAMUSCULAR | Status: AC
  Filled 2012-10-08: qty 2

## 2012-10-08 MED ORDER — CEFAZOLIN SODIUM-DEXTROSE 2-3 GM-% IV SOLR
2.0000 g | INTRAVENOUS | Status: AC
  Administered 2012-10-08: 2 g via INTRAVENOUS

## 2012-10-08 MED ORDER — STERILE WATER FOR IRRIGATION IR SOLN
Status: DC | PRN
  Administered 2012-10-08: 1000 mL

## 2012-10-08 MED ORDER — SODIUM CHLORIDE 0.9 % IR SOLN
Status: DC | PRN
  Administered 2012-10-08: 3000 mL

## 2012-10-08 MED ORDER — ONDANSETRON HCL 4 MG/2ML IJ SOLN
INTRAMUSCULAR | Status: AC
  Filled 2012-10-08: qty 2

## 2012-10-08 MED ORDER — MIDAZOLAM HCL 5 MG/5ML IJ SOLN
INTRAMUSCULAR | Status: DC | PRN
  Administered 2012-10-08 (×2): 1 mg via INTRAVENOUS

## 2012-10-08 MED ORDER — PROPOFOL 10 MG/ML IV BOLUS
INTRAVENOUS | Status: DC | PRN
  Administered 2012-10-08: 150 mg via INTRAVENOUS

## 2012-10-08 MED ORDER — LACTATED RINGERS IV SOLN
INTRAVENOUS | Status: DC
  Administered 2012-10-08: 07:00:00 via INTRAVENOUS

## 2012-10-08 MED ORDER — ONDANSETRON HCL 4 MG/2ML IJ SOLN
4.0000 mg | Freq: Once | INTRAMUSCULAR | Status: AC
  Administered 2012-10-08: 4 mg via INTRAVENOUS

## 2012-10-08 MED ORDER — FENTANYL CITRATE 0.05 MG/ML IJ SOLN
25.0000 ug | INTRAMUSCULAR | Status: DC | PRN
  Administered 2012-10-08 (×2): 50 ug via INTRAVENOUS

## 2012-10-08 MED ORDER — OXYTOCIN 10 UNIT/ML IJ SOLN
INTRAMUSCULAR | Status: AC
  Filled 2012-10-08: qty 2

## 2012-10-08 MED ORDER — ONDANSETRON HCL 4 MG/2ML IJ SOLN
4.0000 mg | Freq: Once | INTRAMUSCULAR | Status: AC | PRN
  Administered 2012-10-08: 4 mg via INTRAVENOUS

## 2012-10-08 MED ORDER — OXYCODONE-ACETAMINOPHEN 5-325 MG PO TABS: 1.0000 | ORAL_TABLET | ORAL | Status: DC | PRN

## 2012-10-08 MED ORDER — BUPIVACAINE-EPINEPHRINE PF 0.5-1:200000 % IJ SOLN
INTRAMUSCULAR | Status: AC
  Filled 2012-10-08: qty 10

## 2012-10-08 MED ORDER — KETOROLAC TROMETHAMINE 30 MG/ML IJ SOLN: 30.0000 mg | Freq: Once | INTRAMUSCULAR | Status: DC

## 2012-10-08 MED ORDER — BUPIVACAINE-EPINEPHRINE 0.5% -1:200000 IJ SOLN
INTRAMUSCULAR | Status: DC | PRN
  Administered 2012-10-08: 20 mL

## 2012-10-08 MED ORDER — LIDOCAINE HCL (PF) 1 % IJ SOLN
INTRAMUSCULAR | Status: AC
  Filled 2012-10-08: qty 5

## 2012-10-08 MED ORDER — CEFAZOLIN SODIUM-DEXTROSE 2-3 GM-% IV SOLR
INTRAVENOUS | Status: AC
  Filled 2012-10-08: qty 50

## 2012-10-08 MED ORDER — LIDOCAINE HCL (CARDIAC) 10 MG/ML IV SOLN
INTRAVENOUS | Status: DC | PRN
  Administered 2012-10-08: 20 mg via INTRAVENOUS

## 2012-10-08 MED ORDER — DEXTROSE 5 % IV SOLN
INTRAVENOUS | Status: DC | PRN
  Administered 2012-10-08: 500 mL via INTRAVENOUS

## 2012-10-08 MED ORDER — FENTANYL CITRATE 0.05 MG/ML IJ SOLN
INTRAMUSCULAR | Status: AC
  Filled 2012-10-08: qty 2

## 2012-10-08 MED ORDER — OXYTOCIN 40 UNITS IN LACTATED RINGERS INFUSION - SIMPLE MED
INTRAVENOUS | Status: DC | PRN
  Administered 2012-10-08: 20 [IU] via INTRAVENOUS

## 2012-10-08 MED ORDER — IBUPROFEN 200 MG PO TABS: 600.0000 mg | ORAL_TABLET | Freq: Four times a day (QID) | ORAL | Status: DC | PRN

## 2012-10-08 MED ORDER — MIDAZOLAM HCL 2 MG/2ML IJ SOLN
1.0000 mg | INTRAMUSCULAR | Status: DC | PRN
  Administered 2012-10-08: 2 mg via INTRAVENOUS

## 2012-10-08 MED ORDER — FENTANYL CITRATE 0.05 MG/ML IJ SOLN
INTRAMUSCULAR | Status: DC | PRN
  Administered 2012-10-08 (×2): 50 ug via INTRAVENOUS
  Administered 2012-10-08: 100 ug via INTRAVENOUS

## 2012-10-08 MED ORDER — PROPOFOL 10 MG/ML IV EMUL
INTRAVENOUS | Status: AC
  Filled 2012-10-08: qty 20

## 2012-10-08 SURGICAL SUPPLY — 27 items
BAG DECANTER FOR FLEXI CONT (MISCELLANEOUS) ×2 IMPLANT
BAG HAMPER (MISCELLANEOUS) ×2 IMPLANT
CATH THERMACHOICE III (CATHETERS) ×2 IMPLANT
CLOTH BEACON ORANGE TIMEOUT ST (SAFETY) ×2 IMPLANT
COVER LIGHT HANDLE STERIS (MISCELLANEOUS) ×4 IMPLANT
FORMALIN 10 PREFIL 120ML (MISCELLANEOUS) ×2 IMPLANT
GLOVE BIOGEL PI IND STRL 7.0 (GLOVE) ×1 IMPLANT
GLOVE BIOGEL PI IND STRL 7.5 (GLOVE) ×2 IMPLANT
GLOVE BIOGEL PI INDICATOR 7.0 (GLOVE) ×1
GLOVE BIOGEL PI INDICATOR 7.5 (GLOVE) ×2
GLOVE ECLIPSE 7.0 STRL STRAW (GLOVE) ×2 IMPLANT
GLOVE ECLIPSE 9.0 STRL (GLOVE) ×2 IMPLANT
GLOVE INDICATOR STER SZ 9 (GLOVE) ×2 IMPLANT
GOWN STRL REIN 3XL LVL4 (GOWN DISPOSABLE) ×2 IMPLANT
GOWN STRL REIN XL XLG (GOWN DISPOSABLE) ×4 IMPLANT
INST SET HYSTEROSCOPY (KITS) IMPLANT
IV D5W 500ML (IV SOLUTION) ×2 IMPLANT
IV NS IRRIG 3000ML ARTHROMATIC (IV SOLUTION) ×2 IMPLANT
KIT ROOM TURNOVER AP CYSTO (KITS) ×2 IMPLANT
MANIFOLD NEPTUNE II (INSTRUMENTS) ×2 IMPLANT
PACK PERI GYN (CUSTOM PROCEDURE TRAY) ×2 IMPLANT
PAD ARMBOARD 7.5X6 YLW CONV (MISCELLANEOUS) ×2 IMPLANT
PAD TELFA 3X4 1S STER (GAUZE/BANDAGES/DRESSINGS) ×2 IMPLANT
SET BASIN LINEN APH (SET/KITS/TRAYS/PACK) ×2 IMPLANT
SET IRRIG Y TYPE TUR BLADDER L (SET/KITS/TRAYS/PACK) ×2 IMPLANT
SYR CONTROL 10ML LL (SYRINGE) ×2 IMPLANT
WATER STERILE IRR 1000ML POUR (IV SOLUTION) ×2 IMPLANT

## 2012-10-08 NOTE — Progress Notes (Signed)
Awake. Wants to go home. Ginger-ale given to drink. Tolerated well.

## 2012-10-08 NOTE — Op Note (Signed)
41 AM  PATIENT:  [name] Hampton  32 y.o. female  PRE-OPERATIVE DIAGNOSIS:  menometorrhagia POST-OPERATIVE DIAGNOSIS:  menometorrhagia PROCEDURE:  Procedure(s) with comments: DILATATION & CURETTAGE/HYSTEROSCOPY WITH THERMACHOICE ABLATION (N/A) - total therapy time- 9 minutes 34 seconds; temp- 83-86 degrees farhernheit  SURGEON:  Surgeon(s) and Role:    * Tilda Burrow, MD - Primary  PHYSICIAN ASSISTANT:  ASSISTANTS: Blackwell CST   ANESTHESIA:   general EBL:  Total I/O In: 900 [I.V.:900] Out: 0  BLOOD ADMINISTERED:none DRAINS: none  LOCAL MEDICATIONS USED:  MARCAINE  20 cc SPECIMEN:  Source of Specimen:  Endometrial curettings  DISPOSITION OF SPECIMEN:  PATHOLOGY  COUNTS:  YES  TOURNIQUET:  * No tourniquets in log *  DICTATION: .Dragon Dictation patient was taken operating room, prepped and draped for vaginal procedure. Timeout conducted and confirmed by surgical team. Ancef administered. Cervix was grasped single-tooth tenaculum, paracervical block applied at 3,5,7 and 9:00 using 20 cc of Marcaine with epinephrine. Uterus was sounded to 10 cm, dilated to 23 [place], and hysteroscopy performed revealing a shaggy endometrium. Curetting was performed and generous then secretory type endometrium was extracted. Repeat hysteroscopy confirmed a denuded endometrial cavity, as well as identify both tubal ostia, with no evidence of perforation. The lower uterine segment was well healed after the cesarean. Gynecare ThermaChoice 3 endometrial ablation device was inserted insufflated to 17 cc. In order to reduce the tendency for uterine stretching, IV oxytocin was infused, 20 units in 500 cc of this saline solution. The 8 minute thermal ablation sequence was completed 17 cc recovered. Patient to recovery room in good condition  PLAN OF CARE: Discharge to home after PACU  PATIENT DISPOSITION:  PACU - hemodynamically stable.   Delay start of Pharmacological VTE agent (>24hrs) due to surgical  blood loss or risk of bleeding: not applicable

## 2012-10-08 NOTE — Transfer of Care (Signed)
Immediate Anesthesia Transfer of Care Note  Patient: [name] Hampton  Procedure(s) Performed: Procedure(s) (LRB): DILATATION & CURETTAGE/HYSTEROSCOPY WITH THERMACHOICE ABLATION (N/A)  Patient Location: PACU  Anesthesia Type: General  Level of Consciousness: awake  Airway & Oxygen Therapy: Patient Spontanous Breathing and non-rebreather face mask  Post-op Assessment: Report given to PACU RN, Post -op Vital signs reviewed and stable and Patient moving all extremities  Post vital signs: Reviewed and stable  Complications: No apparent anesthesia complications

## 2012-10-08 NOTE — Brief Op Note (Signed)
10/08/2012  8:41 AM  PATIENT:  [name] Hampton  32 y.o. female  PRE-OPERATIVE DIAGNOSIS:  menometorrhagia POST-OPERATIVE DIAGNOSIS:  menometorrhagia PROCEDURE:  Procedure(s) with comments: DILATATION & CURETTAGE/HYSTEROSCOPY WITH THERMACHOICE ABLATION (N/A) - total therapy time- 9 minutes 34 seconds; temp- 83-86 degrees farhernheit  SURGEON:  Surgeon(s) and Role:    * Tilda Burrow, MD - Primary  PHYSICIAN ASSISTANT:  ASSISTANTS: Blackwell CST   ANESTHESIA:   general EBL:  Total I/O In: 900 [I.V.:900] Out: 0  BLOOD ADMINISTERED:none DRAINS: none  LOCAL MEDICATIONS USED:  MARCAINE  20 cc SPECIMEN:  Source of Specimen:  Endometrial curettings  DISPOSITION OF SPECIMEN:  PATHOLOGY  COUNTS:  YES  TOURNIQUET:  * No tourniquets in log *  DICTATION: .Dragon Dictation patient was taken operating room, prepped and draped for vaginal procedure. Timeout conducted and confirmed by surgical team. Ancef administered. Cervix was grasped single-tooth tenaculum, paracervical block applied at 3,5,7 and 9:00 using 20 cc of Marcaine with epinephrine. Uterus was sounded to 10 cm, dilated to 23 [place], and hysteroscopy performed revealing a shaggy endometrium. Curetting was performed and generous then secretory type endometrium was extracted. Repeat hysteroscopy confirmed a denuded endometrial cavity, as well as identify both tubal ostia, with no evidence of perforation. The lower uterine segment was well healed after the cesarean. Gynecare ThermaChoice 3 endometrial ablation device was inserted insufflated to 17 cc. In order to reduce the tendency for uterine stretching, IV oxytocin was infused, 20 units in 500 cc of this saline solution. The 8 minute thermal ablation sequence was completed 17 cc recovered. Patient to recovery room in good condition  PLAN OF CARE: Discharge to home after PACU  PATIENT DISPOSITION:  PACU - hemodynamically stable.   Delay start of Pharmacological VTE agent (>24hrs) due  to surgical blood loss or risk of bleeding: not applicable

## 2012-10-08 NOTE — Interval H&P Note (Signed)
History and Physical Interval Note:  10/08/2012 7:20 AM  [name] Hampton  has presented today for surgery, with the diagnosis of menometorrhagia  The various methods of treatment have been discussed with the patient and family. After consideration of risks, benefits and other options for treatment, the patient has consented to  Procedure(s): DILATATION & CURETTAGE/HYSTEROSCOPY WITH THERMACHOICE ABLATION (N/A) as a surgical intervention .  The patient's history has been reviewed, patient examined, no change in status, stable for surgery.  I have reviewed the patient's chart and labs.  Questions were answered to the patient's satisfaction.     Tilda Burrow

## 2012-10-08 NOTE — Anesthesia Procedure Notes (Signed)
Procedure Name: LMA Insertion Date/Time: 10/08/2012 7:42 AM Performed by: Franco Nones Pre-anesthesia Checklist: Patient identified, Patient being monitored, Emergency Drugs available, Timeout performed and Suction available Patient Re-evaluated:Patient Re-evaluated prior to inductionOxygen Delivery Method: Circle System Utilized Preoxygenation: Pre-oxygenation with 100% oxygen Intubation Type: IV induction Ventilation: Mask ventilation without difficulty LMA: LMA inserted LMA Size: 4.0 Number of attempts: 1 Placement Confirmation: positive ETCO2 and breath sounds checked- equal and bilateral

## 2012-10-08 NOTE — Anesthesia Postprocedure Evaluation (Signed)
Anesthesia Post Note  Patient: [name] Hampton  Procedure(s) Performed: Procedure(s) (LRB): DILATATION & CURETTAGE/HYSTEROSCOPY WITH THERMACHOICE ABLATION (N/A)  Anesthesia type: General  Patient location: PACU  Post pain: Pain level controlled  Post assessment: Post-op Vital signs reviewed, Patient's Cardiovascular Status Stable, Respiratory Function Stable, Patent Airway, No signs of Nausea or vomiting and Pain level controlled  Last Vitals:  Filed Vitals:   10/08/12 0843  BP: 113/60  Pulse: 99  Temp: 37.2 C  Resp: 14    Post vital signs: Reviewed and stable  Level of consciousness: awake and alert   Complications: No apparent anesthesia complications

## 2012-10-08 NOTE — Anesthesia Preprocedure Evaluation (Signed)
Anesthesia Evaluation  Patient identified by MRN, date of birth, ID band Patient awake    Reviewed: Allergy & Precautions, H&P , NPO status , Patient's Chart, lab work & pertinent test results  Airway Mallampati: I TM Distance: >3 FB     Dental  (+) Teeth Intact   Pulmonary asthma , Current Smoker,  breath sounds clear to auscultation        Cardiovascular negative cardio ROS  Rhythm:Regular Rate:Normal     Neuro/Psych  Neuromuscular disease    GI/Hepatic GERD-  Medicated and Controlled,  Endo/Other    Renal/GU      Musculoskeletal   Abdominal   Peds  Hematology   Anesthesia Other Findings   Reproductive/Obstetrics                           Anesthesia Physical Anesthesia Plan  ASA: II  Anesthesia Plan: General   Post-op Pain Management:    Induction: Intravenous  Airway Management Planned: LMA  Additional Equipment:   Intra-op Plan:   Post-operative Plan: Extubation in OR  Informed Consent: I have reviewed the patients History and Physical, chart, labs and discussed the procedure including the risks, benefits and alternatives for the proposed anesthesia with the patient or authorized representative who has indicated his/her understanding and acceptance.     Plan Discussed with:   Anesthesia Plan Comments:         Anesthesia Quick Evaluation

## 2012-10-10 ENCOUNTER — Encounter (HOSPITAL_COMMUNITY): Payer: Self-pay | Admitting: Obstetrics and Gynecology

## 2012-10-16 ENCOUNTER — Encounter: Payer: Self-pay | Admitting: *Deleted

## 2012-10-17 ENCOUNTER — Ambulatory Visit (INDEPENDENT_AMBULATORY_CARE_PROVIDER_SITE_OTHER): Payer: Medicaid Other | Admitting: Obstetrics and Gynecology

## 2012-10-17 ENCOUNTER — Encounter: Payer: Self-pay | Admitting: Obstetrics and Gynecology

## 2012-10-17 VITALS — BP 120/70 | Ht 61.5 in | Wt 155.4 lb

## 2012-10-17 DIAGNOSIS — N946 Dysmenorrhea, unspecified: Secondary | ICD-10-CM

## 2012-10-17 DIAGNOSIS — Z9889 Other specified postprocedural states: Secondary | ICD-10-CM

## 2012-10-17 NOTE — Progress Notes (Signed)
Subjective:     [name] Hampton is a 32 y.o. female who presents to the clinic 1 weeks status post  ablation tubal ligation for abnormal uterine bleeding and Sterilization. Eating a regular diet without difficulty. Bowel movements are normal. The patient is not having any pain.    Review of Systems Pertinent items are noted in HPI.    Objective:    BP 120/70  Ht 5' 1.5" (1.562 m)  Wt 155 lb 6.4 oz (70.489 kg)  BMI 28.89 kg/m2  Breastfeeding? No General:  no distress and Dental extraction 2 days after surgery  Abdomen: soft, bowel sounds active, non-tender  Incision:   healing well, incision well approximated     pelvic: Normal external genitalia except for a rash, patient to begin using tampons cervix closed Assessment:    Doing well postoperatively. Operative findings again reviewed. Pathology report discussed.    Plan:    1. Continue any current medications. 2. Wound care discussed. 3. Activity restrictions: Full relation normal activity 4. Anticipated return to work: not applicable. 5. Follow up: 1 Year

## 2012-10-17 NOTE — Patient Instructions (Signed)
May resume all normal activities

## 2012-11-11 ENCOUNTER — Other Ambulatory Visit: Payer: Self-pay | Admitting: Obstetrics & Gynecology

## 2012-11-11 MED ORDER — PANTOPRAZOLE SODIUM 40 MG PO TBEC: 40.0000 mg | DELAYED_RELEASE_TABLET | Freq: Two times a day (BID) | ORAL | Status: DC

## 2013-02-27 ENCOUNTER — Emergency Department (HOSPITAL_COMMUNITY)
Admission: EM | Admit: 2013-02-27 | Discharge: 2013-02-28 | Disposition: A | Discharge status: Home / Self Care | Payer: Self-pay | Attending: Emergency Medicine | Admitting: Emergency Medicine

## 2013-02-27 ENCOUNTER — Encounter (HOSPITAL_COMMUNITY): Payer: Self-pay

## 2013-02-27 DIAGNOSIS — J45909 Unspecified asthma, uncomplicated: Secondary | ICD-10-CM | POA: Insufficient documentation

## 2013-02-27 DIAGNOSIS — Z79899 Other long term (current) drug therapy: Secondary | ICD-10-CM | POA: Insufficient documentation

## 2013-02-27 DIAGNOSIS — Z862 Personal history of diseases of the blood and blood-forming organs and certain disorders involving the immune mechanism: Secondary | ICD-10-CM | POA: Insufficient documentation

## 2013-02-27 DIAGNOSIS — R059 Cough, unspecified: Secondary | ICD-10-CM | POA: Insufficient documentation

## 2013-02-27 DIAGNOSIS — Z8639 Personal history of other endocrine, nutritional and metabolic disease: Secondary | ICD-10-CM | POA: Insufficient documentation

## 2013-02-27 DIAGNOSIS — R0789 Other chest pain: Secondary | ICD-10-CM

## 2013-02-27 DIAGNOSIS — K219 Gastro-esophageal reflux disease without esophagitis: Secondary | ICD-10-CM | POA: Insufficient documentation

## 2013-02-27 DIAGNOSIS — F17200 Nicotine dependence, unspecified, uncomplicated: Secondary | ICD-10-CM | POA: Insufficient documentation

## 2013-02-27 DIAGNOSIS — R071 Chest pain on breathing: Secondary | ICD-10-CM | POA: Insufficient documentation

## 2013-02-27 DIAGNOSIS — Z8619 Personal history of other infectious and parasitic diseases: Secondary | ICD-10-CM | POA: Insufficient documentation

## 2013-02-27 DIAGNOSIS — Z8669 Personal history of other diseases of the nervous system and sense organs: Secondary | ICD-10-CM | POA: Insufficient documentation

## 2013-02-27 DIAGNOSIS — R05 Cough: Secondary | ICD-10-CM | POA: Insufficient documentation

## 2013-02-27 NOTE — ED Notes (Signed)
Pt reports "dull pressure" to center of chest that is worse with deep breath and movement

## 2013-02-28 ENCOUNTER — Emergency Department (HOSPITAL_COMMUNITY): Payer: Self-pay

## 2013-02-28 LAB — CBC
HCT: 39 % (ref 36.0–46.0)
Hemoglobin: 13.7 g/dL (ref 12.0–15.0)
RBC: 4.57 MIL/uL (ref 3.87–5.11)
RDW: 13.2 % (ref 11.5–15.5)
WBC: 8.9 10*3/uL (ref 4.0–10.5)

## 2013-02-28 LAB — POCT I-STAT TROPONIN I: Troponin i, poc: 0 ng/mL (ref 0.00–0.08)

## 2013-02-28 LAB — POCT I-STAT, CHEM 8
Calcium, Ion: 1.18 mmol/L (ref 1.12–1.23)
Chloride: 107 mEq/L (ref 96–112)
HCT: 40 % (ref 36.0–46.0)
Potassium: 3.8 mEq/L (ref 3.5–5.1)
Sodium: 141 mEq/L (ref 135–145)

## 2013-02-28 MED ORDER — KETOROLAC TROMETHAMINE 60 MG/2ML IM SOLN
60.0000 mg | Freq: Once | INTRAMUSCULAR | Status: AC
  Administered 2013-02-28: 60 mg via INTRAMUSCULAR
  Filled 2013-02-28: qty 2

## 2013-02-28 MED ORDER — IBUPROFEN 800 MG PO TABS: 800.0000 mg | ORAL_TABLET | Freq: Three times a day (TID) | ORAL | Status: DC

## 2013-02-28 NOTE — ED Provider Notes (Signed)
CSN: [id number]     Arrival date & time 02/27/13  2338 History     None    Chief Complaint  Patient presents with  . Chest Pain   (Consider location/radiation/quality/duration/timing/severity/associated sxs/prior Treatment) HPI  Hx per PT, around 5pm onset of substernal dull chest pain worse with a deep breath, no trauma, was at work when this started.  No radiating of pain. No SOB, no leg pain or swelling, no h/o same. Is a smoker, some dry cough tonight which also makes this worse, but no recent illness otherwise. No rash. No F/C. Pain mod in severity.   Past Medical History  Diagnosis Date  . FSHD (facioscapulohumeral muscular dystrophy)   . GERD (gastroesophageal reflux disease)   . Asthma     exercised induced  . Hypoglycemia   . Headache(784.0)   . Muscular dystrophy   . Hx of chlamydia infection    Past Surgical History  Procedure Laterality Date  . Tonsillectomy    . Diagnostic laparoscopy  1994  . Cesarean section with bilateral tubal ligation  08/08/2012    x2 Procedure: CESAREAN SECTION WITH BILATERAL TUBAL LIGATION;  Surgeon: Tilda Burrow, MD;  Location: [facility];  Service: Obstetrics;  Laterality: N/A;  Repeat  . Tubal ligation    . Dilitation & currettage/hystroscopy with thermachoice ablation N/A 10/08/2012    Procedure: DILATATION & CURETTAGE/HYSTEROSCOPY WITH THERMACHOICE ABLATION;  Surgeon: Tilda Burrow, MD;  Location: [facility];  Service: Gynecology;  Laterality: N/A;  total therapy time- 9 minutes 34 seconds; temp- 83-86 degrees farhernheit   Family History  Problem Relation Age of Onset  . Emphysema Mother   . Muscular dystrophy Mother   . Diabetes Father   . Hypertension Father   . Hyperlipidemia Father   . Fibromyalgia Sister   . Muscular dystrophy Brother   . Muscular dystrophy Sister   . Cancer Other    History  Substance Use Topics  . Smoking status: Current Every Day Smoker -- 16 years    Types: Cigarettes  . Smokeless tobacco: Not on file      Comment: 3 cigarrettes per day   . Alcohol Use: 4.2 oz/week    3 Glasses of wine, 4 Cans of beer per week     Comment: occassional   OB History   Grav Para Term Preterm Abortions TAB SAB Ect Mult Living   2 2 1       2      Review of Systems  Constitutional: Negative for fever and chills.  HENT: Negative for neck pain and neck stiffness.   Eyes: Negative for pain.  Respiratory: Negative for shortness of breath.   Cardiovascular: Positive for chest pain.  Gastrointestinal: Negative for abdominal pain.  Genitourinary: Negative for dysuria.  Musculoskeletal: Negative for back pain.  Skin: Negative for rash.  Neurological: Negative for headaches.  All other systems reviewed and are negative.    Allergies  Peppermint oil and Erythromycin  Home Medications   Current Outpatient Rx  Name  Route  Sig  Dispense  Refill  . acetaminophen (TYLENOL) 500 MG tablet   Oral   Take 500 mg by mouth every 6 (six) hours as needed for pain.         Marland Kitchen FLUoxetine (PROZAC) 10 MG tablet   Oral   Take 10 mg by mouth daily.         Marland Kitchen ibuprofen (MOTRIN IB) 200 MG tablet   Oral   Take 3 tablets (600 mg  total) by mouth every 6 (six) hours as needed for pain.   30 tablet   0   . Prenatal Vit-Fe Fumarate-FA (PRENATAL MULTIVITAMIN) TABS   Oral   Take 1 tablet by mouth daily.         Marland Kitchen ibuprofen (ADVIL,MOTRIN) 200 MG tablet   Oral   Take 600 mg by mouth every 6 (six) hours as needed for pain.         Marland Kitchen oxyCODONE-acetaminophen (PERCOCET/ROXICET) 5-325 MG per tablet   Oral   Take 1 tablet by mouth every 4 (four) hours as needed for pain.   20 tablet   0   . pantoprazole (PROTONIX) 40 MG tablet   Oral   Take 1 tablet (40 mg total) by mouth 2 (two) times daily.   60 tablet   3    BP 102/55  Pulse 76  Temp(Src) 98.5 F (36.9 C) (Oral)  Resp 16  Ht 5' 1.5" (1.562 m)  Wt 160 lb (72.576 kg)  BMI 29.75 kg/m2  SpO2 98% Physical Exam  Constitutional: She is oriented to  person, place, and time. She appears well-developed and well-nourished.  HENT:  Head: Normocephalic and atraumatic.  Eyes: EOM are normal. Pupils are equal, round, and reactive to light.  Neck: Neck supple.  Cardiovascular: Normal rate, regular rhythm and intact distal pulses.   Pulmonary/Chest: Effort normal and breath sounds normal. No respiratory distress.  Anterior chest wall tenderness, no crepitus or rash  Musculoskeletal: Normal range of motion. She exhibits no edema.  No calf tenderness or swelling  Neurological: She is alert and oriented to person, place, and time.  Skin: Skin is warm and dry.    ED Course   Procedures (including critical care time)  Results for orders placed during the hospital encounter of 02/27/13  CBC      Result Value Range   WBC 8.9  4.0 - 10.5 K/uL   RBC 4.57  3.87 - 5.11 MIL/uL   Hemoglobin 13.7  12.0 - 15.0 g/dL   HCT 40.9  81.1 - 91.4 %   MCV 85.3  78.0 - 100.0 fL   MCH 30.0  26.0 - 34.0 pg   MCHC 35.1  30.0 - 36.0 g/dL   RDW 78.2  95.6 - 21.3 %   Platelets 349  150 - 400 K/uL  D-DIMER, QUANTITATIVE      Result Value Range   D-Dimer, Quant <0.27  0.00 - 0.48 ug/mL-FEU  POCT I-STAT TROPONIN I      Result Value Range   Troponin i, poc 0.00  0.00 - 0.08 ng/mL   Comment 3           POCT I-STAT, CHEM 8      Result Value Range   Sodium 141  135 - 145 mEq/L   Potassium 3.8  3.5 - 5.1 mEq/L   Chloride 107  96 - 112 mEq/L   BUN 13  6 - 23 mg/dL   Creatinine, Ser 0.86  0.50 - 1.10 mg/dL   Glucose, Bld 91  70 - 99 mg/dL   Calcium, Ion 5.78  4.69 - 1.23 mmol/L   TCO2 23  0 - 100 mmol/L   Hemoglobin 13.6  12.0 - 15.0 g/dL   HCT 62.9  52.8 - 41.3 %   Dg Chest Portable 1 View  02/28/2013   *RADIOLOGY REPORT*  Clinical Data: Pain.  PORTABLE CHEST - 1 VIEW  Comparison: 01/14/2008  Findings: Heart size and pulmonary vascularity are normal  and the lungs are clear.  No osseous abnormality.  IMPRESSION: Normal exam.   Original Report Authenticated By:  Francene Boyers, M.D.       Date: 02/28/2013  Rate: 77  Rhythm: normal sinus rhythm  QRS Axis: normal  Intervals: normal  ST/T Wave abnormalities: nonspecific ST changes  Conduction Disutrbances:none  Narrative Interpretation:   Old EKG Reviewed: none available  IM toradol  1:50 AM on recheck, pain is improving and patient feels comfortable with plan discharge home. Prescription for NSAIDs provided with chest wall pain precautions verbalized as understood. Patient agrees to followup with her primary care physician.   MDM  Reproducible chest wall pain  Evaluated with EKG, chest x-ray and labs reviewed as above.  Improved with medications  Vital signs and nursing notes reviewed and considered    Sunnie Nielsen, MD 02/28/13 0151

## 2013-05-15 ENCOUNTER — Other Ambulatory Visit: Payer: Self-pay

## 2013-11-15 ENCOUNTER — Emergency Department (HOSPITAL_COMMUNITY): Payer: 59

## 2013-11-15 ENCOUNTER — Encounter (HOSPITAL_COMMUNITY): Payer: Self-pay | Admitting: Emergency Medicine

## 2013-11-15 ENCOUNTER — Emergency Department (HOSPITAL_COMMUNITY)
Admission: EM | Admit: 2013-11-15 | Discharge: 2013-11-15 | Disposition: A | Discharge status: Home / Self Care | Payer: 59 | Attending: Emergency Medicine | Admitting: Emergency Medicine

## 2013-11-15 DIAGNOSIS — Z791 Long term (current) use of non-steroidal anti-inflammatories (NSAID): Secondary | ICD-10-CM | POA: Insufficient documentation

## 2013-11-15 DIAGNOSIS — Z79899 Other long term (current) drug therapy: Secondary | ICD-10-CM | POA: Insufficient documentation

## 2013-11-15 DIAGNOSIS — Z3202 Encounter for pregnancy test, result negative: Secondary | ICD-10-CM | POA: Insufficient documentation

## 2013-11-15 DIAGNOSIS — J45990 Exercise induced bronchospasm: Secondary | ICD-10-CM | POA: Insufficient documentation

## 2013-11-15 DIAGNOSIS — Z8639 Personal history of other endocrine, nutritional and metabolic disease: Secondary | ICD-10-CM | POA: Insufficient documentation

## 2013-11-15 DIAGNOSIS — Z8619 Personal history of other infectious and parasitic diseases: Secondary | ICD-10-CM | POA: Insufficient documentation

## 2013-11-15 DIAGNOSIS — Z8669 Personal history of other diseases of the nervous system and sense organs: Secondary | ICD-10-CM | POA: Insufficient documentation

## 2013-11-15 DIAGNOSIS — F17200 Nicotine dependence, unspecified, uncomplicated: Secondary | ICD-10-CM | POA: Insufficient documentation

## 2013-11-15 DIAGNOSIS — M25539 Pain in unspecified wrist: Secondary | ICD-10-CM | POA: Insufficient documentation

## 2013-11-15 DIAGNOSIS — K219 Gastro-esophageal reflux disease without esophagitis: Secondary | ICD-10-CM | POA: Insufficient documentation

## 2013-11-15 DIAGNOSIS — Z862 Personal history of diseases of the blood and blood-forming organs and certain disorders involving the immune mechanism: Secondary | ICD-10-CM | POA: Insufficient documentation

## 2013-11-15 DIAGNOSIS — R079 Chest pain, unspecified: Secondary | ICD-10-CM | POA: Insufficient documentation

## 2013-11-15 LAB — CBC WITH DIFFERENTIAL/PLATELET
BASOS PCT: 0 % (ref 0–1)
Basophils Absolute: 0 10*3/uL (ref 0.0–0.1)
EOS ABS: 0.3 10*3/uL (ref 0.0–0.7)
Eosinophils Relative: 4 % (ref 0–5)
HCT: 37.5 % (ref 36.0–46.0)
HEMOGLOBIN: 13.1 g/dL (ref 12.0–15.0)
LYMPHS ABS: 3.1 10*3/uL (ref 0.7–4.0)
Lymphocytes Relative: 34 % (ref 12–46)
MCH: 30.4 pg (ref 26.0–34.0)
MCHC: 34.9 g/dL (ref 30.0–36.0)
MCV: 87 fL (ref 78.0–100.0)
MONOS PCT: 5 % (ref 3–12)
Monocytes Absolute: 0.4 10*3/uL (ref 0.1–1.0)
NEUTROS ABS: 5.3 10*3/uL (ref 1.7–7.7)
Neutrophils Relative %: 57 % (ref 43–77)
PLATELETS: 335 10*3/uL (ref 150–400)
RBC: 4.31 MIL/uL (ref 3.87–5.11)
RDW: 12.2 % (ref 11.5–15.5)
WBC: 9.1 10*3/uL (ref 4.0–10.5)

## 2013-11-15 LAB — HEPATIC FUNCTION PANEL
ALT: 14 U/L (ref 0–35)
AST: 15 U/L (ref 0–37)
Albumin: 3.7 g/dL (ref 3.5–5.2)
Alkaline Phosphatase: 62 U/L (ref 39–117)
Total Bilirubin: 0.2 mg/dL — ABNORMAL LOW (ref 0.3–1.2)
Total Protein: 7.1 g/dL (ref 6.0–8.3)

## 2013-11-15 LAB — BASIC METABOLIC PANEL
BUN: 14 mg/dL (ref 6–23)
CO2: 23 mEq/L (ref 19–32)
Calcium: 9 mg/dL (ref 8.4–10.5)
Chloride: 102 mEq/L (ref 96–112)
Creatinine, Ser: 0.87 mg/dL (ref 0.50–1.10)
GFR, EST NON AFRICAN AMERICAN: 86 mL/min — AB (ref >90)
Glucose, Bld: 90 mg/dL (ref 70–99)
Potassium: 4.2 mEq/L (ref 3.7–5.3)
Sodium: 138 mEq/L (ref 137–147)

## 2013-11-15 LAB — URINALYSIS, ROUTINE W REFLEX MICROSCOPIC
Bilirubin Urine: NEGATIVE
Glucose, UA: NEGATIVE mg/dL
HGB URINE DIPSTICK: NEGATIVE
Ketones, ur: NEGATIVE mg/dL
LEUKOCYTES UA: NEGATIVE
Nitrite: NEGATIVE
PROTEIN: NEGATIVE mg/dL
Specific Gravity, Urine: >1.03 — ABNORMAL HIGH (ref 1.005–1.030)
UROBILINOGEN UA: 0.2 mg/dL (ref 0.0–1.0)
pH: 6 (ref 5.0–8.0)

## 2013-11-15 LAB — D-DIMER, QUANTITATIVE (NOT AT ARMC): D-Dimer, Quant: <0.27 ug/mL-FEU (ref 0.00–0.48)

## 2013-11-15 LAB — TROPONIN I: Troponin I: <0.3 ng/mL (ref <0.30)

## 2013-11-15 LAB — PREGNANCY, URINE: Preg Test, Ur: NEGATIVE

## 2013-11-15 MED ORDER — GI COCKTAIL ~~LOC~~
30.0000 mL | Freq: Once | ORAL | Status: AC
  Administered 2013-11-15: 30 mL via ORAL
  Filled 2013-11-15: qty 30

## 2013-11-15 MED ORDER — HYDROCODONE-ACETAMINOPHEN 5-325 MG PO TABS: 1.0000 | ORAL_TABLET | Freq: Four times a day (QID) | ORAL | Status: DC | PRN

## 2013-11-15 MED ORDER — HYDROCODONE-ACETAMINOPHEN 5-325 MG PO TABS
1.0000 | ORAL_TABLET | Freq: Once | ORAL | Status: AC
  Administered 2013-11-15: 1 via ORAL
  Filled 2013-11-15: qty 1

## 2013-11-15 MED ORDER — LORAZEPAM 1 MG PO TABS
1.0000 mg | ORAL_TABLET | Freq: Once | ORAL | Status: AC
  Administered 2013-11-15: 1 mg via ORAL
  Filled 2013-11-15: qty 1

## 2013-11-15 NOTE — ED Notes (Signed)
Chest pain onset this am. 

## 2013-11-15 NOTE — ED Notes (Signed)
New lab order will be added to existing blood in lab. Pt using phone to call husband to get a ride home d/t medication given in ED.

## 2013-11-15 NOTE — ED Notes (Signed)
PT c/o chest pain and some pain with breathing started at 1130 today. PT has hx of muscular dystrophy.

## 2013-11-15 NOTE — ED Notes (Signed)
MD at bedside. 

## 2013-11-15 NOTE — ED Provider Notes (Signed)
CSN: [id number]     Arrival date & time 11/15/13  1547 History  This chart was scribed for Benny LennertJoseph L Deanglo Hissong, MD by Quintella ReichertMatthew Underwood, ED scribe.  This patient was seen in room [facility] and the patient's care was started at 5:00 PM.   Chief Complaint  Patient presents with  . Chest Pain    Patient is a 33 y.o. female presenting with chest pain. The history is provided by the patient. No language interpreter was used.  Chest Pain Pain quality comment:  "like my chest was caving in" Pain severity now: moderate-to-severe. Timing:  Constant Progression:  Partially resolved Chronicity:  New Relieved by: Tylenol. Associated symptoms: no abdominal pain, no back pain, no cough, no fatigue, no fever, no headache, no numbness, no shortness of breath and no weakness     HPI Comments: [name] StallSarah E Casados is a 33 y.o. female with h/o FSHD, GERD, and exercise-induced asthma who presents to the Emergency Department complaining of chest pain that began 5 and 1/2 hours ago.  Pt reports she was at work at 11:30 when she suddenly developed "overwheleming" chest pain described as "my chest felt like it was caving in."  She also experienced a less severe transient "burning" sensation in her chest but she states this was "overwhelmed" by her other pain, and "the burning went away and the pain was still there."  Her severe pain lasted until she took Tylenol at 12:30 PM.  It has greatly improved since then but has persisted and currently she rates pain at 4/10.  Pain is worsened by deep breathing ("makes it heavier feeling") but she denies SOB.  She denies fever, cough, or leg pain.  Pt denies prior h/o similar symptoms.  She has worked the same job for 11 years and denies recent increases in stress level.  She works as a Photographerfront desk clerk at the [employer] and states "it's really an easy job."  She has taken Protonix in the past but is not taking it currently.  She is a current every-day smoker.  She had a uterine  ablation one year ago for menorrhagia and has not had a period since then.  She also has h/o tubal ligation.  Pt also complains of 2 months of intermittent "burning" pain to her right wrist that is brought on by heavy lifting.  She denies weakness, numbness or tingling.   Past Medical History  Diagnosis Date  . FSHD (facioscapulohumeral muscular dystrophy)   . GERD (gastroesophageal reflux disease)   . Asthma     exercised induced  . Hypoglycemia   . Headache(784.0)   . Muscular dystrophy   . Hx of chlamydia infection     Past Surgical History  Procedure Laterality Date  . Tonsillectomy    . Diagnostic laparoscopy  1994  . Cesarean section with bilateral tubal ligation  08/08/2012    x2 Procedure: CESAREAN SECTION WITH BILATERAL TUBAL LIGATION;  Surgeon: Tilda BurrowJohn V Ferguson, MD;  Location: [facility];  Service: Obstetrics;  Laterality: N/A;  Repeat  . Tubal ligation    . Dilitation & currettage/hystroscopy with thermachoice ablation N/A 10/08/2012    Procedure: DILATATION & CURETTAGE/HYSTEROSCOPY WITH THERMACHOICE ABLATION;  Surgeon: Tilda BurrowJohn V Ferguson, MD;  Location: [facility];  Service: Gynecology;  Laterality: N/A;  total therapy time- 9 minutes 34 seconds; temp- 83-86 degrees farhernheit    Family History  Problem Relation Age of Onset  . Emphysema Mother   . Muscular dystrophy Mother   . Diabetes Father   .  Hypertension Father   . Hyperlipidemia Father   . Fibromyalgia Sister   . Muscular dystrophy Brother   . Muscular dystrophy Sister   . Cancer Other     History  Substance Use Topics  . Smoking status: Current Every Day Smoker -- 16 years    Types: Cigarettes  . Smokeless tobacco: Not on file     Comment: 3 cigarrettes per day   . Alcohol Use: 4.2 oz/week    3 Glasses of wine, 4 Cans of beer per week     Comment: occassional    OB History   Grav Para Term Preterm Abortions TAB SAB Ect Mult Living   2 2 1       2        Review of Systems  Constitutional: Negative for  fever, appetite change and fatigue.  HENT: Negative for congestion, ear discharge and sinus pressure.   Eyes: Negative for discharge.  Respiratory: Negative for cough and shortness of breath.   Cardiovascular: Positive for chest pain.  Gastrointestinal: Negative for abdominal pain and diarrhea.  Genitourinary: Negative for frequency and hematuria.  Musculoskeletal: Positive for arthralgias (right wrist). Negative for back pain.  Skin: Negative for rash.  Neurological: Negative for seizures, weakness, numbness and headaches.  Psychiatric/Behavioral: Negative for hallucinations.      Allergies  Peppermint oil and Erythromycin  Home Medications   Prior to Admission medications   Medication Sig Start Date End Date Taking? Authorizing Provider  acetaminophen (TYLENOL) 500 MG tablet Take 500 mg by mouth every 6 (six) hours as needed for pain.    Historical Provider, MD  FLUoxetine (PROZAC) 10 MG tablet Take 10 mg by mouth daily.    Historical Provider, MD  ibuprofen (ADVIL,MOTRIN) 200 MG tablet Take 600 mg by mouth every 6 (six) hours as needed for pain.    Historical Provider, MD  ibuprofen (ADVIL,MOTRIN) 800 MG tablet Take 1 tablet (800 mg total) by mouth 3 (three) times daily. 02/28/13   Sunnie NielsenBrian Opitz, MD  ibuprofen (MOTRIN IB) 200 MG tablet Take 3 tablets (600 mg total) by mouth every 6 (six) hours as needed for pain. 10/08/12   Tilda BurrowJohn V Ferguson, MD  oxyCODONE-acetaminophen (PERCOCET/ROXICET) 5-325 MG per tablet Take 1 tablet by mouth every 4 (four) hours as needed for pain. 10/08/12   Tilda BurrowJohn V Ferguson, MD  pantoprazole (PROTONIX) 40 MG tablet Take 1 tablet (40 mg total) by mouth 2 (two) times daily. 11/11/12   Lazaro ArmsLuther H Eure, MD  Prenatal Vit-Fe Fumarate-FA (PRENATAL MULTIVITAMIN) TABS Take 1 tablet by mouth daily.    Historical Provider, MD   BP 123/61  Pulse 73  Temp(Src) 98.7 F (37.1 C) (Oral)  Resp 16  Ht 5' 1.5" (1.562 m)  Wt 162 lb (73.483 kg)  BMI 30.12 kg/m2  SpO2 97%  Physical  Exam  Nursing note and vitals reviewed. Constitutional: She is oriented to person, place, and time. She appears well-developed.  HENT:  Head: Normocephalic.  Eyes: Conjunctivae and EOM are normal. No scleral icterus.  Neck: Neck supple. No thyromegaly present.  Cardiovascular: Normal rate, regular rhythm and normal heart sounds.  Exam reveals no gallop and no friction rub.   No murmur heard. Pulmonary/Chest: Effort normal and breath sounds normal. No stridor. She has no wheezes. She has no rales. She exhibits no tenderness.  Abdominal: She exhibits no distension. There is no tenderness. There is no rebound.  Musculoskeletal: Normal range of motion. She exhibits no edema.  Right wrist: She exhibits no tenderness.       Right hand: She exhibits no tenderness.  Lymphadenopathy:    She has no cervical adenopathy.  Neurological: She is oriented to person, place, and time. She exhibits normal muscle tone. Coordination normal.  Skin: No rash noted. No erythema.  Psychiatric: She has a normal mood and affect. Her behavior is normal.    ED Course  Procedures (including critical care time)  DIAGNOSTIC STUDIES: Oxygen Saturation is 97% on room air, normal by my interpretation.    COORDINATION OF CARE: 5:06 PM-Discussed treatment plan which includes GI cocktail, CXR, x-ray of wrist, EKG and labs with pt at bedside and pt agreed to plan.    Labs Review Labs Reviewed  BASIC METABOLIC PANEL - Abnormal; Notable for the following:    GFR calc non Af Amer 86 (*)    All other components within normal limits  HEPATIC FUNCTION PANEL - Abnormal; Notable for the following:    Total Bilirubin 0.2 (*)    All other components within normal limits  URINALYSIS, ROUTINE W REFLEX MICROSCOPIC - Abnormal; Notable for the following:    APPearance CLOUDY (*)    Specific Gravity, Urine >1.030 (*)    All other components within normal limits  TROPONIN I  CBC WITH DIFFERENTIAL  PREGNANCY, URINE   D-DIMER, QUANTITATIVE    Imaging Review Dg Chest 2 View  11/15/2013   CLINICAL DATA:  Chest pain.  EXAM: CHEST  2 VIEW  COMPARISON:  02/28/2013  FINDINGS: The heart size and mediastinal contours are within normal limits. Both lungs are clear. The visualized skeletal structures are unremarkable.  IMPRESSION: No active cardiopulmonary disease.   Electronically Signed   By: Myles Rosenthal M.D.   On: 11/15/2013 16:59   Dg Wrist Complete Right  11/15/2013   CLINICAL DATA:  CHEST PAIN right wrist pain. Pain at the ulnar styloid process for 2 months. No injury.  EXAM: RIGHT WRIST - COMPLETE 3+ VIEW  COMPARISON:  None.  FINDINGS: Anatomic alignment of the wrist. No fracture. Nonspecific carpal cyst present in the distal scaphoid pole. Ulnar styloid appears normal. No mass or are erosion. Soft tissues of the wrist appear normal.  IMPRESSION: No acute osseous abnormality. Normal appearance of the ulnar styloid.   Electronically Signed   By: Andreas Newport M.D.   On: 11/15/2013 17:37     EKG Interpretation None      MDM   Final diagnoses:  None   Noncardiac chest pain The chart was scribed for me under my direct supervision.  I personally performed the history, physical, and medical decision making and all procedures in the evaluation of this patient.Benny Lennert, MD 11/15/13 [phone]

## 2013-11-15 NOTE — Discharge Instructions (Signed)
Follow up with a family md in 1-2 weeks,  You can call Dr. Phillips OdorGolding,  Or Dr. Juanetta GoslingHawkins, or Dr. Lodema HongSimpson

## 2014-05-11 ENCOUNTER — Encounter (HOSPITAL_COMMUNITY): Payer: Self-pay | Admitting: Emergency Medicine

## 2014-05-28 ENCOUNTER — Encounter (HOSPITAL_COMMUNITY): Payer: Self-pay

## 2014-05-28 DIAGNOSIS — Z9889 Other specified postprocedural states: Secondary | ICD-10-CM | POA: Insufficient documentation

## 2014-05-28 DIAGNOSIS — Z3202 Encounter for pregnancy test, result negative: Secondary | ICD-10-CM | POA: Insufficient documentation

## 2014-05-28 DIAGNOSIS — Z8619 Personal history of other infectious and parasitic diseases: Secondary | ICD-10-CM | POA: Insufficient documentation

## 2014-05-28 DIAGNOSIS — R110 Nausea: Secondary | ICD-10-CM | POA: Insufficient documentation

## 2014-05-28 DIAGNOSIS — Z720 Tobacco use: Secondary | ICD-10-CM | POA: Insufficient documentation

## 2014-05-28 DIAGNOSIS — Z8719 Personal history of other diseases of the digestive system: Secondary | ICD-10-CM | POA: Insufficient documentation

## 2014-05-28 DIAGNOSIS — Z9851 Tubal ligation status: Secondary | ICD-10-CM | POA: Insufficient documentation

## 2014-05-28 DIAGNOSIS — J45909 Unspecified asthma, uncomplicated: Secondary | ICD-10-CM | POA: Insufficient documentation

## 2014-05-28 DIAGNOSIS — R1031 Right lower quadrant pain: Secondary | ICD-10-CM | POA: Diagnosis present

## 2014-05-28 DIAGNOSIS — N8320 Unspecified ovarian cysts: Secondary | ICD-10-CM | POA: Insufficient documentation

## 2014-05-28 DIAGNOSIS — Z79899 Other long term (current) drug therapy: Secondary | ICD-10-CM | POA: Insufficient documentation

## 2014-05-28 DIAGNOSIS — Z8639 Personal history of other endocrine, nutritional and metabolic disease: Secondary | ICD-10-CM | POA: Diagnosis not present

## 2014-05-28 DIAGNOSIS — Z8669 Personal history of other diseases of the nervous system and sense organs: Secondary | ICD-10-CM | POA: Diagnosis not present

## 2014-05-28 NOTE — ED Notes (Signed)
Pain to right lower abd intermittently, sharp in nature, has subsided at the moment, denies injury or strain, c/o nausea, no diarrhea

## 2014-05-29 ENCOUNTER — Emergency Department (HOSPITAL_COMMUNITY): Payer: 59

## 2014-05-29 ENCOUNTER — Emergency Department (HOSPITAL_COMMUNITY)
Admission: EM | Admit: 2014-05-29 | Discharge: 2014-05-29 | Disposition: A | Discharge status: Home / Self Care | Payer: 59 | Attending: Emergency Medicine | Admitting: Emergency Medicine

## 2014-05-29 DIAGNOSIS — R1031 Right lower quadrant pain: Secondary | ICD-10-CM

## 2014-05-29 DIAGNOSIS — N83209 Unspecified ovarian cyst, unspecified side: Secondary | ICD-10-CM

## 2014-05-29 LAB — URINALYSIS, ROUTINE W REFLEX MICROSCOPIC
BILIRUBIN URINE: NEGATIVE
Glucose, UA: NEGATIVE mg/dL
HGB URINE DIPSTICK: NEGATIVE
KETONES UR: NEGATIVE mg/dL
Leukocytes, UA: NEGATIVE
NITRITE: NEGATIVE
PROTEIN: NEGATIVE mg/dL
Specific Gravity, Urine: 1.02 (ref 1.005–1.030)
UROBILINOGEN UA: 0.2 mg/dL (ref 0.0–1.0)
pH: 6 (ref 5.0–8.0)

## 2014-05-29 LAB — CBC WITH DIFFERENTIAL/PLATELET
Basophils Absolute: 0 10*3/uL (ref 0.0–0.1)
Basophils Relative: 0 % (ref 0–1)
Eosinophils Absolute: 0.4 10*3/uL (ref 0.0–0.7)
Eosinophils Relative: 4 % (ref 0–5)
HCT: 40.2 % (ref 36.0–46.0)
HEMOGLOBIN: 14.1 g/dL (ref 12.0–15.0)
LYMPHS ABS: 3.6 10*3/uL (ref 0.7–4.0)
LYMPHS PCT: 35 % (ref 12–46)
MCH: 30.1 pg (ref 26.0–34.0)
MCHC: 35.1 g/dL (ref 30.0–36.0)
MCV: 85.9 fL (ref 78.0–100.0)
MONOS PCT: 6 % (ref 3–12)
Monocytes Absolute: 0.6 10*3/uL (ref 0.1–1.0)
NEUTROS ABS: 5.6 10*3/uL (ref 1.7–7.7)
NEUTROS PCT: 55 % (ref 43–77)
Platelets: 332 10*3/uL (ref 150–400)
RBC: 4.68 MIL/uL (ref 3.87–5.11)
RDW: 12.6 % (ref 11.5–15.5)
WBC: 10.2 10*3/uL (ref 4.0–10.5)

## 2014-05-29 LAB — BASIC METABOLIC PANEL
Anion gap: 12 (ref 5–15)
BUN: 11 mg/dL (ref 6–23)
CHLORIDE: 99 meq/L (ref 96–112)
CO2: 26 mEq/L (ref 19–32)
Calcium: 9 mg/dL (ref 8.4–10.5)
Creatinine, Ser: 0.86 mg/dL (ref 0.50–1.10)
GFR, EST NON AFRICAN AMERICAN: 87 mL/min — AB (ref >90)
GLUCOSE: 81 mg/dL (ref 70–99)
POTASSIUM: 3.9 meq/L (ref 3.7–5.3)
Sodium: 137 mEq/L (ref 137–147)

## 2014-05-29 LAB — WET PREP, GENITAL
CLUE CELLS WET PREP: NONE SEEN
TRICH WET PREP: NONE SEEN
Yeast Wet Prep HPF POC: NONE SEEN

## 2014-05-29 LAB — POC URINE PREG, ED: Preg Test, Ur: NEGATIVE

## 2014-05-29 MED ORDER — HYDROCODONE-ACETAMINOPHEN 5-325 MG PO TABS: 1.0000 | ORAL_TABLET | Freq: Four times a day (QID) | ORAL | Status: DC | PRN

## 2014-05-29 MED ORDER — NAPROXEN 500 MG PO TABS
500.0000 mg | ORAL_TABLET | Freq: Two times a day (BID) | ORAL | Status: DC
Start: 2014-05-29 — End: 2017-07-02

## 2014-05-29 MED ORDER — KETOROLAC TROMETHAMINE 60 MG/2ML IM SOLN: 60.0000 mg | Freq: Once | INTRAMUSCULAR | Status: DC

## 2014-05-29 MED ORDER — IOHEXOL 300 MG/ML  SOLN
25.0000 mL | Freq: Once | INTRAMUSCULAR | Status: AC | PRN
  Administered 2014-05-29: 25 mL via ORAL

## 2014-05-29 MED ORDER — KETOROLAC TROMETHAMINE 30 MG/ML IJ SOLN
30.0000 mg | Freq: Once | INTRAMUSCULAR | Status: AC
  Administered 2014-05-29: 30 mg via INTRAVENOUS
  Filled 2014-05-29: qty 1

## 2014-05-29 MED ORDER — IOHEXOL 300 MG/ML  SOLN
100.0000 mL | Freq: Once | INTRAMUSCULAR | Status: AC | PRN
  Administered 2014-05-29: 100 mL via INTRAVENOUS

## 2014-05-29 NOTE — ED Provider Notes (Signed)
CSN: [id number]     Arrival date & time 05/28/14  2314 History   First MD Initiated Contact with Patient 05/29/14 0042     Chief Complaint  Patient presents with  . Abdominal Pain     (Consider location/radiation/quality/duration/timing/severity/associated sxs/prior Treatment) Patient is a 33 y.o. female presenting with abdominal pain. The history is provided by the patient.  Abdominal Pain Pain location:  RLQ Pain quality: sharp and shooting   Pain radiates to:  Does not radiate Pain severity:  Severe Onset quality:  Sudden Duration:  5 hours Timing:  Constant Progression:  Worsening Chronicity:  New Relieved by:  Nothing Worsened by:  Nothing tried Ineffective treatments:  None tried Associated symptoms: nausea   Associated symptoms: no chest pain, no chills, no constipation, no cough, no diarrhea, no dysuria, no fever, no shortness of breath, no vaginal bleeding and no vaginal discharge    [name] Hampton is a 33 y.o. female who presents to the ED with lower abdominal pain that started suddenly while standing behind the counter at work. The pain while she is lying still is a constant dull pain but then has episodes of sharp stabbing pain. She has had a BTL and ablation. Hx of Chlamydia.    Past Medical History  Diagnosis Date  . FSHD (facioscapulohumeral muscular dystrophy)   . GERD (gastroesophageal reflux disease)   . Asthma     exercised induced  . Hypoglycemia   . Headache(784.0)   . Muscular dystrophy   . Hx of chlamydia infection    Past Surgical History  Procedure Laterality Date  . Tonsillectomy    . Diagnostic laparoscopy  1994  . Cesarean section with bilateral tubal ligation  08/08/2012    x2 Procedure: CESAREAN SECTION WITH BILATERAL TUBAL LIGATION;  Surgeon: Tilda Burrow, MD;  Location: [facility];  Service: Obstetrics;  Laterality: N/A;  Repeat  . Tubal ligation    . Dilitation & currettage/hystroscopy with thermachoice ablation N/A 10/08/2012    Procedure:  DILATATION & CURETTAGE/HYSTEROSCOPY WITH THERMACHOICE ABLATION;  Surgeon: Tilda Burrow, MD;  Location: [facility];  Service: Gynecology;  Laterality: N/A;  total therapy time- 9 minutes 34 seconds; temp- 83-86 degrees farhernheit   Family History  Problem Relation Age of Onset  . Emphysema Mother   . Muscular dystrophy Mother   . Diabetes Father   . Hypertension Father   . Hyperlipidemia Father   . Fibromyalgia Sister   . Muscular dystrophy Brother   . Muscular dystrophy Sister   . Cancer Other    History  Substance Use Topics  . Smoking status: Current Every Day Smoker -- 16 years    Types: Cigarettes  . Smokeless tobacco: Not on file     Comment: 3 cigarrettes per day   . Alcohol Use: 4.2 oz/week    3 Glasses of wine, 4 Cans of beer per week     Comment: occassional   OB History    Gravida Para Term Preterm AB TAB SAB Ectopic Multiple Living   2 2 1       2      Review of Systems  Constitutional: Negative for fever and chills.  HENT: Negative.   Eyes: Negative for visual disturbance.  Respiratory: Negative for cough and shortness of breath.   Cardiovascular: Negative for chest pain.  Gastrointestinal: Positive for nausea and abdominal pain. Negative for diarrhea and constipation.  Genitourinary: Positive for pelvic pain. Negative for dysuria, frequency, vaginal bleeding and vaginal discharge.  Musculoskeletal:  Negative for back pain.  Skin: Negative for rash.  Neurological: Negative for dizziness, syncope and headaches.  Psychiatric/Behavioral: Negative for confusion. The patient is not nervous/anxious.       Allergies  Peppermint oil and Erythromycin  Home Medications   Prior to Admission medications   Medication Sig Start Date End Date Taking? Authorizing Provider  acetaminophen (TYLENOL) 500 MG tablet Take 1,000 mg by mouth every 6 (six) hours as needed for pain.    Yes Historical Provider, MD  FLUoxetine (PROZAC) 40 MG capsule Take 40 mg by mouth daily.   Yes  Historical Provider, MD  hydrOXYzine (VISTARIL) 25 MG capsule Take 25 mg by mouth 2 (two) times daily.   Yes Historical Provider, MD  ibuprofen (ADVIL,MOTRIN) 200 MG tablet Take 600 mg by mouth every 6 (six) hours as needed for pain.   Yes Historical Provider, MD  LORazepam (ATIVAN) 1 MG tablet Take 1 mg by mouth every 6 (six) hours as needed for anxiety.   Yes Historical Provider, MD  traZODone (DESYREL) 50 MG tablet Take 50 mg by mouth at bedtime.   Yes Historical Provider, MD  HYDROcodone-acetaminophen (NORCO/VICODIN) 5-325 MG per tablet Take 1 tablet by mouth every 6 (six) hours as needed for moderate pain. 11/15/13   Benny LennertJoseph L Zammit, MD   BP 132/78 mmHg  Pulse 87  Temp(Src) 98.9 F (37.2 C) (Oral)  Resp 18  Ht 5' 1.5" (1.562 m)  Wt 173 lb (78.472 kg)  BMI 32.16 kg/m2  SpO2 98% Physical Exam  Constitutional: She is oriented to person, place, and time. She appears well-developed and well-nourished. No distress.  HENT:  Head: Normocephalic and atraumatic.  Eyes: Conjunctivae and EOM are normal.  Neck: Neck supple.  Cardiovascular: Normal rate and regular rhythm.   Pulmonary/Chest: Effort normal and breath sounds normal.  Abdominal: Soft. Bowel sounds are normal. There is tenderness in the right lower quadrant. There is tenderness at McBurney's point. There is no CVA tenderness.  Genitourinary:  External genitalia without lesions, cervix with mild CMT, right adnexal tenderness. Uterus without palpable enlargement.   Musculoskeletal: Normal range of motion.  Neurological: She is alert and oriented to person, place, and time. No cranial nerve deficit.  Skin: Skin is warm and dry.  Psychiatric: She has a normal mood and affect. Her behavior is normal.  Nursing note and vitals reviewed.   ED Course  Procedures  Dr. Read DriversMolpus in to examine the patient.   Results for orders placed or performed during the hospital encounter of 05/29/14 (from the past 24 hour(s))  Urinalysis with  microscopic     Status: None   Collection Time: 05/29/14 12:50 AM  Result Value Ref Range   Color, Urine YELLOW YELLOW   APPearance CLEAR CLEAR   Specific Gravity, Urine 1.020 1.005 - 1.030   pH 6.0 5.0 - 8.0   Glucose, UA NEGATIVE NEGATIVE mg/dL   Hgb urine dipstick NEGATIVE NEGATIVE   Bilirubin Urine NEGATIVE NEGATIVE   Ketones, ur NEGATIVE NEGATIVE mg/dL   Protein, ur NEGATIVE NEGATIVE mg/dL   Urobilinogen, UA 0.2 0.0 - 1.0 mg/dL   Nitrite NEGATIVE NEGATIVE   Leukocytes, UA NEGATIVE NEGATIVE  POC urine preg, ED (not at [facility])     Status: None   Collection Time: 05/29/14 12:53 AM  Result Value Ref Range   Preg Test, Ur NEGATIVE NEGATIVE  CBC with Differential     Status: None   Collection Time: 05/29/14  1:03 AM  Result Value Ref Range  WBC 10.2 4.0 - 10.5 K/uL   RBC 4.68 3.87 - 5.11 MIL/uL   Hemoglobin 14.1 12.0 - 15.0 g/dL   HCT 16.140.2 09.636.0 - 04.546.0 %   MCV 85.9 78.0 - 100.0 fL   MCH 30.1 26.0 - 34.0 pg   MCHC 35.1 30.0 - 36.0 g/dL   RDW 40.912.6 81.111.5 - 91.415.5 %   Platelets 332 150 - 400 K/uL   Neutrophils Relative % 55 43 - 77 %   Neutro Abs 5.6 1.7 - 7.7 K/uL   Lymphocytes Relative 35 12 - 46 %   Lymphs Abs 3.6 0.7 - 4.0 K/uL   Monocytes Relative 6 3 - 12 %   Monocytes Absolute 0.6 0.1 - 1.0 K/uL   Eosinophils Relative 4 0 - 5 %   Eosinophils Absolute 0.4 0.0 - 0.7 K/uL   Basophils Relative 0 0 - 1 %   Basophils Absolute 0.0 0.0 - 0.1 K/uL  Basic metabolic panel     Status: Abnormal   Collection Time: 05/29/14  1:03 AM  Result Value Ref Range   Sodium 137 137 - 147 mEq/L   Potassium 3.9 3.7 - 5.3 mEq/L   Chloride 99 96 - 112 mEq/L   CO2 26 19 - 32 mEq/L   Glucose, Bld 81 70 - 99 mg/dL   BUN 11 6 - 23 mg/dL   Creatinine, Ser 7.820.86 0.50 - 1.10 mg/dL   Calcium 9.0 8.4 - 95.610.5 mg/dL   GFR calc non Af Amer 87 (L) >90 mL/min   GFR calc Af Amer >90 >90 mL/min   Anion gap 12 5 - 15    MDM  33 y.o. female with right lower quadrant abdominal pain that started suddenly tonight  while at work. She is awaiting CT. Dr. Read DriversMolpus to assume care of the patient.    [facility] Orlene OchM Neese, NP 05/29/14 0155  Hanley SeamenJohn L Molpus, MD 05/29/14 [phone]

## 2014-05-29 NOTE — ED Notes (Signed)
Discharge instructions and prescription given and reviewed with patient.  Patient verbalized understanding to take medication as directed and to follow up with OB/GYN.  Patient ambulatory; discharged home in good condition.

## 2014-05-30 LAB — GC/CHLAMYDIA PROBE AMP
CT Probe RNA: NEGATIVE
GC Probe RNA: NEGATIVE

## 2014-06-02 MED FILL — Hydrocodone-Acetaminophen Tab 5-325 MG: ORAL | Qty: 6 | Status: AC

## 2014-07-27 ENCOUNTER — Encounter: Payer: Self-pay | Admitting: Obstetrics and Gynecology

## 2014-07-27 ENCOUNTER — Ambulatory Visit (INDEPENDENT_AMBULATORY_CARE_PROVIDER_SITE_OTHER): Payer: 59 | Admitting: Obstetrics and Gynecology

## 2014-07-27 VITALS — BP 120/80 | Wt 179.0 lb

## 2014-07-27 DIAGNOSIS — N939 Abnormal uterine and vaginal bleeding, unspecified: Secondary | ICD-10-CM

## 2014-07-27 MED ORDER — MEGESTROL ACETATE 40 MG PO TABS: 40.0000 mg | ORAL_TABLET | Freq: Three times a day (TID) | ORAL | Status: DC

## 2014-07-27 NOTE — Progress Notes (Signed)
Patient ID: [name] StallSarah E Hampton, female   DOB: 10/06/1980, 34 y.o.   MRN: [id number]   [facility]  Patient name: [name] Hampton MRN [id number]  Date of birth: 10/06/1980  CC & HPI:  [name] Hampton is a 34 y.o. female s/p ablation presenting today for constant abnormal vaginal bleeding that started approximately on December 20.  She states that her flow is light but will gradually become heavier.  She was admitted to the ED in November for lower abdominal pain which was attributed to an ovarian cyst.  She had a CAT scan done at that time which showed that the cyst had ruptured.  Prior to this ED visit, she had been experiencing intermittent lower abdominal cramping.  She is not in any pain currently.  Her pap smears are UTD.   ROS:  All systems have been reviewed and are negative unless otherwise indicated in the HPI.   Pertinent History Reviewed:   Reviewed: Significant for endometrial ablation, tubal ligation Medical         Past Medical History  Diagnosis Date  . FSHD (facioscapulohumeral muscular dystrophy)   . GERD (gastroesophageal reflux disease)   . Asthma     exercised induced  . Hypoglycemia   . Headache(784.0)   . Muscular dystrophy   . Hx of chlamydia infection                               Surgical Hx:    Past Surgical History  Procedure Laterality Date  . Tonsillectomy    . Diagnostic laparoscopy  1994  . Cesarean section with bilateral tubal ligation  08/08/2012    x2 Procedure: CESAREAN SECTION WITH BILATERAL TUBAL LIGATION;  Surgeon: Tilda BurrowJohn V Wash Nienhaus, MD;  Location: [facility];  Service: Obstetrics;  Laterality: N/A;  Repeat  . Tubal ligation    . Dilitation & currettage/hystroscopy with thermachoice ablation N/A 10/08/2012    Procedure: DILATATION & CURETTAGE/HYSTEROSCOPY WITH THERMACHOICE ABLATION;  Surgeon: Tilda BurrowJohn V Maxim Bedel, MD;  Location: [facility];  Service: Gynecology;  Laterality: N/A;  total therapy time- 9 minutes 34 seconds; temp- 83-86 degrees farhernheit    Medications: Reviewed & Updated - see associated section                       Current outpatient prescriptions:  .  acetaminophen (TYLENOL) 500 MG tablet, Take 1,000 mg by mouth every 6 (six) hours as needed for pain. , Disp: , Rfl:  .  FLUoxetine (PROZAC) 40 MG capsule, Take 40 mg by mouth daily., Disp: , Rfl:  .  hydrOXYzine (VISTARIL) 25 MG capsule, Take 25 mg by mouth 2 (two) times daily., Disp: , Rfl:  .  LORazepam (ATIVAN) 1 MG tablet, Take 1 mg by mouth every 6 (six) hours as needed for anxiety., Disp: , Rfl:  .  HYDROcodone-acetaminophen (NORCO) 5-325 MG per tablet, Take 1 tablet by mouth every 6 (six) hours as needed for moderate pain. (Patient not taking: Reported on 07/27/2014), Disp: 6 tablet, Rfl: 0 .  naproxen (NAPROSYN) 500 MG tablet, Take 1 tablet (500 mg total) by mouth 2 (two) times daily. (Patient not taking: Reported on 07/27/2014), Disp: 20 tablet, Rfl: 0 .  traZODone (DESYREL) 50 MG tablet, Take 50 mg by mouth at bedtime., Disp: , Rfl:    Social History: Reviewed -  reports that she has been smoking Cigarettes.  She has smoked for  the past 16 years. She does not have any smokeless tobacco history on file.  Objective Findings:  Vitals: Blood pressure 120/80, weight 179 lb (81.194 kg), last menstrual period 06/29/2014, unknown if currently breastfeeding.  Physical Examination: General appearance - alert, well appearing, and in no distress, oriented to person, place, and time and overweight Pelvic - normal external genitalia, vulva, vagina, cervix, uterus and adnexa, VULVA: normal appearing vulva with no masses, tenderness or lesions, VAGINA: normal appearing vagina, no lesions, normal support, normal, nonpurulent secretions with a dark discoloration due to normal menstrual processes CERVIX: normal appearing cervix without discharge or lesions, UTERUS: uterus is normal shape, consistency and nontender, small, mobile ADNEXA: normal adnexa in size, nontender and no  masses   Assessment & Plan:   A:  1. Normal menses 2. Hx endometrial ablation 3. Ovarian cyst, resolved.  P:  1. PNR followup 2. Megace 40 mg tid til bleeding stops, the daily til fu 4 wk. This chart was scribed for Tilda Burrow, MD by Carl Best, ED Scribe. This patient was seen in Room 1 and the patient's care was started at 12:28 PM.

## 2014-08-05 ENCOUNTER — Encounter (HOSPITAL_COMMUNITY): Payer: Self-pay | Admitting: Nurse Practitioner

## 2014-09-07 ENCOUNTER — Encounter: Payer: Self-pay | Admitting: Obstetrics and Gynecology

## 2014-09-07 ENCOUNTER — Ambulatory Visit (INDEPENDENT_AMBULATORY_CARE_PROVIDER_SITE_OTHER): Payer: 59 | Admitting: Obstetrics and Gynecology

## 2014-09-07 VITALS — BP 110/80 | HR 88 | Ht 61.2 in | Wt 175.8 lb

## 2014-09-07 DIAGNOSIS — N939 Abnormal uterine and vaginal bleeding, unspecified: Secondary | ICD-10-CM | POA: Diagnosis not present

## 2014-09-07 DIAGNOSIS — F4312 Post-traumatic stress disorder, chronic: Secondary | ICD-10-CM

## 2014-09-07 HISTORY — DX: Post-traumatic stress disorder, chronic: F43.12

## 2014-09-07 HISTORY — DX: Abnormal uterine and vaginal bleeding, unspecified: N93.9
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
## 2016-07-22 ENCOUNTER — Emergency Department (HOSPITAL_COMMUNITY)
Admission: EM | Admit: 2016-07-22 | Discharge: 2016-07-23 | Disposition: A | Discharge status: Home / Self Care | Payer: Self-pay | Attending: Emergency Medicine | Admitting: Emergency Medicine

## 2016-07-22 ENCOUNTER — Encounter (HOSPITAL_COMMUNITY): Payer: Self-pay | Admitting: Emergency Medicine

## 2016-07-22 DIAGNOSIS — F17210 Nicotine dependence, cigarettes, uncomplicated: Secondary | ICD-10-CM | POA: Insufficient documentation

## 2016-07-22 DIAGNOSIS — J45909 Unspecified asthma, uncomplicated: Secondary | ICD-10-CM | POA: Insufficient documentation

## 2016-07-22 DIAGNOSIS — Z79899 Other long term (current) drug therapy: Secondary | ICD-10-CM | POA: Insufficient documentation

## 2016-07-22 DIAGNOSIS — M779 Enthesopathy, unspecified: Secondary | ICD-10-CM | POA: Insufficient documentation

## 2016-07-22 DIAGNOSIS — M778 Other enthesopathies, not elsewhere classified: Secondary | ICD-10-CM

## 2016-07-22 HISTORY — DX: Post-traumatic stress disorder, unspecified: F43.10

## 2016-07-22 NOTE — ED Triage Notes (Signed)
Pt states she woke up this morning with pain in her right wrist.  No known injury

## 2016-07-23 ENCOUNTER — Emergency Department (HOSPITAL_COMMUNITY): Payer: Self-pay

## 2016-07-23 MED ORDER — IBUPROFEN 800 MG PO TABS
800.0000 mg | ORAL_TABLET | Freq: Once | ORAL | Status: AC
  Administered 2016-07-23: 800 mg via ORAL
  Filled 2016-07-23: qty 1

## 2016-07-23 NOTE — ED Provider Notes (Signed)
[facility] Provider Note   CSN: [id number] Arrival date & time: 07/22/16  2311     History   Chief Complaint Chief Complaint  Patient presents with  . Wrist Pain    HPI [name] Hampton is a 36 y.o.right handed female presenting with acute pain in her right thumb which radiates to her distal wrist with certain movements, especially attempts to flex the thumb.  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  She denies numbness and weakness in the thumb except as limited by pain.  She has taken ibuprofen 800 mg with some relief of pain.    The history is provided by the patient.    Past Medical History:  Diagnosis Date  . Asthma    exercised induced  . FSHD (facioscapulohumeral muscular dystrophy) (HCC)   . GERD (gastroesophageal reflux disease)   . Headache(784.0)   . Hx of chlamydia infection   . Hypoglycemia   . Muscular dystrophy (HCC)   . PTSD (post-traumatic stress disorder)     Patient Active Problem List   Diagnosis Date Noted  . Chronic post-traumatic stress disorder (PTSD) 09/07/2014  . Abnormal uterine bleeding (AUB) 09/07/2014    Past Surgical History:  Procedure Laterality Date  . CESAREAN SECTION WITH BILATERAL TUBAL LIGATION  08/08/2012   x2 Procedure: CESAREAN SECTION WITH BILATERAL TUBAL LIGATION;  Surgeon: Tilda BurrowJohn V Ferguson, MD;  Location: [facility];  Service: Obstetrics;  Laterality: N/A;  Repeat  . DIAGNOSTIC LAPAROSCOPY  1994  . DILITATION & CURRETTAGE/HYSTROSCOPY WITH THERMACHOICE ABLATION N/A 10/08/2012   Procedure: DILATATION & CURETTAGE/HYSTEROSCOPY WITH THERMACHOICE ABLATION;  Surgeon: Tilda BurrowJohn V Ferguson, MD;  Location: [facility];  Service: Gynecology;  Laterality: N/A;  total therapy time- 9 minutes 34 seconds; temp- 83-86 degrees farhernheit  . TONSILLECTOMY    . TUBAL LIGATION      OB History    Gravida Para Term Preterm AB Living   2 2 1     2    SAB TAB Ectopic  Multiple Live Births           1       Home Medications    Prior to Admission medications   Medication Sig Start Date End Date Taking? Authorizing Provider  acetaminophen (TYLENOL) 500 MG tablet Take 1,000 mg by mouth every 6 (six) hours as needed for pain.     Historical Provider, MD  FLUoxetine (PROZAC) 40 MG capsule Take 40 mg by mouth daily.    Historical Provider, MD  HYDROcodone-acetaminophen (NORCO) 5-325 MG per tablet Take 1 tablet by mouth every 6 (six) hours as needed for moderate pain. Patient not taking: Reported on 07/27/2014 05/29/14   Janne NapoleonHope M Neese, NP  hydrOXYzine (VISTARIL) 25 MG capsule Take 25 mg by mouth 2 (two) times daily.    Historical Provider, MD  LORazepam (ATIVAN) 1 MG tablet Take 1 mg by mouth every 6 (six) hours as needed for anxiety.    Historical Provider, MD  naproxen (NAPROSYN) 500 MG tablet Take 1 tablet (500 mg total) by mouth 2 (two) times daily. Patient not taking: Reported on 07/27/2014 05/29/14   Janne NapoleonHope M Neese, NP  traZODone (DESYREL) 50 MG tablet Take 50 mg by mouth at bedtime.    Historical Provider, MD    Family History Family History  Problem Relation Age of Onset  . Emphysema Mother   . Muscular dystrophy Mother   . Diabetes Father   .  Hypertension Father   . Hyperlipidemia Father   . Fibromyalgia Sister   . Muscular dystrophy Brother   . Muscular dystrophy Sister   . Cancer Other     Social History Social History  Substance Use Topics  . Smoking status: Current Every Day Smoker    Packs/day: 0.50    Years: 16.00    Types: Cigarettes  . Smokeless tobacco: Never Used     Comment: 3 cigarrettes per day   . Alcohol use 4.2 oz/week    3 Glasses of wine, 4 Cans of beer per week     Comment: occassional     Allergies   Peppermint oil and Erythromycin   Review of Systems Review of Systems  Constitutional: Negative for fever.  Musculoskeletal: Positive for arthralgias. Negative for joint swelling and myalgias.  Neurological:  Negative for weakness and numbness.     Physical Exam Updated Vital Signs BP 123/75 (BP Location: Left Arm)   Pulse 77   Temp 98.1 F (36.7 C) (Oral)   Resp 18   Ht 5' 1.5" (1.562 m)   Wt 81.6 kg   SpO2 96%   BMI 33.46 kg/m   Physical Exam  Constitutional: She appears well-developed and well-nourished.  HENT:  Head: Atraumatic.  Neck: Normal range of motion.  Cardiovascular:  Pulses equal bilaterally  Musculoskeletal: She exhibits tenderness.       Right hand: She exhibits decreased range of motion and tenderness. She exhibits normal capillary refill and no deformity. Normal sensation noted.  ttp along dorsal right proximal thumb and metacarpal.  Negative snuff box tenderness. No crepitus with ROM of thumb.  No edema or erythema. Small abrasion dorsal mid metacarpal of thumb.    Neurological: She is alert. She has normal strength. She displays normal reflexes. No sensory deficit.  Skin: Skin is warm and dry.  Psychiatric: She has a normal mood and affect.     ED Treatments / Results  Labs (all labs ordered are listed, but only abnormal results are displayed) Labs Reviewed - No data to display  EKG  EKG Interpretation None       Radiology No results found.  Procedures Procedures (including critical care time)  Medications Ordered in ED Medications  ibuprofen (ADVIL,MOTRIN) tablet 800 mg (800 mg Oral Given 07/23/16 0108)     Initial Impression / Assessment and Plan / ED Course  I have reviewed the triage vital signs and the nursing notes.  Pertinent labs & imaging results that were available during my care of the patient were reviewed by me and considered in my medical decision making (see chart for details).  Clinical Course     Imaging reviewed and negative.  Pt placed in thumb spica, rest, elevation, heat tx.  Ibuprofen.  F/u with pcp for a recheck if sx persist or do not improve with tx.    The patient appears reasonably screened and/or stabilized  for discharge and I doubt any other medical condition or other [facility] requiring further screening, evaluation, or treatment in the ED at this time prior to discharge.   Final Clinical Impressions(s) / ED Diagnoses   Final diagnoses:  Tendinitis of thumb    New Prescriptions New Prescriptions   No medications on file     Burgess Amor, PA-C 07/23/16 0115    Devoria Albe, MD 07/23/16 [phone]

## 2016-07-23 NOTE — Discharge Instructions (Signed)
As discussed, use the thumb brace to rest your thumb as much as possible. Use heat therapy 20 minutes several times daily.  Continue taking ibuprofen 800 mg every 8 hours.  Call your doctor for a recheck if these steps are not improving your pain over the next 7-10 days.

## 2017-07-01 DIAGNOSIS — Z79899 Other long term (current) drug therapy: Secondary | ICD-10-CM | POA: Insufficient documentation

## 2017-07-01 DIAGNOSIS — M25512 Pain in left shoulder: Secondary | ICD-10-CM | POA: Insufficient documentation

## 2017-07-01 DIAGNOSIS — J45909 Unspecified asthma, uncomplicated: Secondary | ICD-10-CM | POA: Insufficient documentation

## 2017-07-01 DIAGNOSIS — F17210 Nicotine dependence, cigarettes, uncomplicated: Secondary | ICD-10-CM | POA: Insufficient documentation

## 2017-07-02 ENCOUNTER — Emergency Department (HOSPITAL_COMMUNITY)
Admission: EM | Admit: 2017-07-02 | Discharge: 2017-07-02 | Disposition: A | Discharge status: Home / Self Care | Payer: Self-pay | Attending: Emergency Medicine | Admitting: Emergency Medicine

## 2017-07-02 ENCOUNTER — Other Ambulatory Visit: Payer: Self-pay

## 2017-07-02 ENCOUNTER — Encounter (HOSPITAL_COMMUNITY): Payer: Self-pay | Admitting: *Deleted

## 2017-07-02 DIAGNOSIS — M898X1 Other specified disorders of bone, shoulder: Secondary | ICD-10-CM

## 2017-07-02 DIAGNOSIS — M791 Myalgia, unspecified site: Secondary | ICD-10-CM

## 2017-07-02 MED ORDER — METHOCARBAMOL 500 MG PO TABS: ORAL_TABLET | ORAL | 0 refills | Status: DC

## 2017-07-02 MED ORDER — NAPROXEN 250 MG PO TABS: ORAL_TABLET | ORAL | 0 refills | Status: DC

## 2017-07-02 MED ORDER — OMEPRAZOLE 20 MG PO CPDR: DELAYED_RELEASE_CAPSULE | ORAL | 0 refills | Status: DC

## 2017-07-02 MED ORDER — KETOROLAC TROMETHAMINE 30 MG/ML IJ SOLN
30.0000 mg | Freq: Once | INTRAMUSCULAR | Status: AC
  Administered 2017-07-02: 30 mg via INTRAMUSCULAR
  Filled 2017-07-02: qty 1

## 2017-07-02 NOTE — Discharge Instructions (Signed)
Use ice and heat for comfort. Take the medications as prescribed. If you aren't improving, call Dr Mort SawyersHarrison's office, the orthopedist on call, to have him evaluate your pain. He may want to inject the area with steroids.

## 2017-07-02 NOTE — ED Notes (Signed)
Pt ambulatory to waiting room. Pt verbalized understanding of discharge instructions.   

## 2017-07-02 NOTE — ED Triage Notes (Signed)
Pt c/o left shoulder pain x 5 days; pt denies any obvious injury

## 2017-07-02 NOTE — ED Provider Notes (Signed)
[facility] EMERGENCY DEPARTMENT Provider Note   CSN: [id number] Arrival date & time: 07/01/17  2308  Time seen 12:45 AM   History   Chief Complaint Chief Complaint  Patient presents with  . Shoulder Pain    HPI [name] Hampton is a 36 y.o. female.  HPI patient states she was awakened about 5 days ago, December 20 about 3:30 in the morning with pain in her left shoulder.  She states she had gone to sleep about 2:30 AM.  She denies any known injury or change in her activity.  She states the pain is constant but it waxes and wanes.  She states she is right-handed.  She states movement does make it worse, drinking alcohol and taking ibuprofen does help.  She has taken 3 over-the-counter ibuprofen around 2 PM the last several days with improvement.  She states the pain gets worse as the day goes on.  She states sometimes she has some tingling down her left arm.  She also states for the last 3 days she has had a pain in her head and points to an area just above her left ear.  She denies any weakness in her arm although she is feels like it may be weak when she grips or drink but she is not dropping anything.  She said she is never had this before.  She denies cough or shortness of breath.  She states she is also had indigestion at night "for a while".  She states however it has been more persistent the last few nights.  Patient states she was diagnosed with F HSD about 20 years ago when a test of her family.  She was tested positive as did her brother, sister, mother, maternal aunt, and maternal grandmother.  She states she does not have any symptoms.  She denies any family history of coronary artery disease.  PCP Vertis Kelch, NP (Inactive)   Past Medical History:  Diagnosis Date  . Asthma    exercised induced  . FSHD (facioscapulohumeral muscular dystrophy)   . GERD (gastroesophageal reflux disease)   . Headache(784.0)   . Hx of chlamydia infection   . Hypoglycemia   . Muscular  dystrophy   . PTSD (post-traumatic stress disorder)     Patient Active Problem List   Diagnosis Date Noted  . Chronic post-traumatic stress disorder (PTSD) 09/07/2014  . Abnormal uterine bleeding (AUB) 09/07/2014    Past Surgical History:  Procedure Laterality Date  . CESAREAN SECTION WITH BILATERAL TUBAL LIGATION  08/08/2012   x2 Procedure: CESAREAN SECTION WITH BILATERAL TUBAL LIGATION;  Surgeon: Tilda Burrow, MD;  Location: [facility];  Service: Obstetrics;  Laterality: N/A;  Repeat  . DIAGNOSTIC LAPAROSCOPY  1994  . DILITATION & CURRETTAGE/HYSTROSCOPY WITH THERMACHOICE ABLATION N/A 10/08/2012   Procedure: DILATATION & CURETTAGE/HYSTEROSCOPY WITH THERMACHOICE ABLATION;  Surgeon: Tilda Burrow, MD;  Location: [facility];  Service: Gynecology;  Laterality: N/A;  total therapy time- 9 minutes 34 seconds; temp- 83-86 degrees farhernheit  . TONSILLECTOMY    . TUBAL LIGATION      OB History    Gravida Para Term Preterm AB Living   2 2 1     2    SAB TAB Ectopic Multiple Live Births           1       Home Medications    Prior to Admission medications   Medication Sig Start Date End Date Taking? Authorizing Provider  buPROPion (WELLBUTRIN) 100 MG tablet  Take 50 mg by mouth daily.   Yes [provider]  FLUoxetine (PROZAC) 40 MG capsule Take 40 mg by mouth daily.   Yes [provider]  hydrOXYzine (VISTARIL) 25 MG capsule Take 25 mg by mouth 2 (two) times daily.   Yes [provider]  acetaminophen (TYLENOL) 500 MG tablet Take 1,000 mg by mouth every 6 (six) hours as needed for pain.     [provider]  HYDROcodone-acetaminophen (NORCO) 5-325 MG per tablet Take 1 tablet by mouth every 6 (six) hours as needed for moderate pain. Patient not taking: Reported on 07/27/2014 05/29/14   Janne NapoleonNeese, Hope M, NP  LORazepam (ATIVAN) 1 MG tablet Take 1 mg by mouth every 6 (six) hours as needed for anxiety.    [provider]  methocarbamol (ROBAXIN) 500 MG  tablet Take 1 or 2 po Q 6hrs for muscle pain 07/02/17   Devoria AlbeKnapp, Even Budlong, MD  naproxen (NAPROSYN) 250 MG tablet Take 1 po BID with food prn pain 07/02/17   Devoria AlbeKnapp, Madyx Delfin, MD  omeprazole (PRILOSEC) 20 MG capsule Take 1 po BID x 2 weeks then once a day 07/02/17   Devoria AlbeKnapp, Quinntin Malter, MD  traZODone (DESYREL) 50 MG tablet Take 50 mg by mouth at bedtime.    [provider]    Family History Family History  Problem Relation Age of Onset  . Emphysema Mother   . Muscular dystrophy Mother   . Diabetes Father   . Hypertension Father   . Hyperlipidemia Father   . Fibromyalgia Sister   . Muscular dystrophy Brother   . Muscular dystrophy Sister   . Cancer Other     Social History Social History   Tobacco Use  . Smoking status: Current Every Day Smoker    Packs/day: 0.25    Years: 16.00    Pack years: 4.00    Types: Cigarettes  . Smokeless tobacco: Never Used  . Tobacco comment: 3 cigarrettes per day   Substance Use Topics  . Alcohol use: Yes    Alcohol/week: 4.2 oz    Types: 3 Glasses of wine, 4 Cans of beer per week    Comment: occassional  . 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    Physical Exam Updated Vital Signs BP 134/72 (BP Location: Right Arm)   Pulse 90   Temp 98.3 F (36.8 C) (Oral)   Resp 20   Ht 5' 1.5" (1.562 m)   Wt 83 kg (183 lb)   SpO2 97%   BMI 34.02 kg/m   Vital signs normal    Physical Exam  Constitutional: She is oriented to person, place, and time. She appears well-developed and well-nourished. No distress.  HENT:  Head: Normocephalic and atraumatic.  Right Ear: External ear normal.  Left Ear: External ear normal.  Nose: Nose normal.  Eyes: Conjunctivae and EOM are normal.  Neck: Normal range of motion. Neck supple.  Patient is nontender in her cervical  spine, she is also nontender over the left trapezius muscle.  Cardiovascular: Normal rate.  Pulmonary/Chest: Effort normal. No respiratory distress.  Musculoskeletal: Normal range of motion. She exhibits tenderness. She exhibits no edema or deformity.       Arms: Patient has no pain to palpation of her left shoulder joint at all.  She has good range of motion of her shoulder joint without pain.  However she is tender in a well localized area near the medial superior border of the left scapula that reproduces her complaints of pain.  Her thoracic spine is nontender, her cervical spine is nontender, her lumbar spine is nontender.  Neurological: She is alert and oriented to person, place, and time. No cranial nerve deficit.  Skin: Skin is warm and dry. Capillary refill takes less than 2 seconds. No rash noted. No erythema.  Psychiatric: She has a normal mood and affect. Her behavior is normal. Thought content normal.  Nursing note and vitals reviewed.    ED Treatments / Results  Labs (all labs ordered are listed, but only abnormal results are displayed) Labs Reviewed - No data to display  EKG  EKG Interpretation  Date/Time:  Monday July 02 2017 00:50:33 EST Ventricular Rate:  86 PR Interval:    QRS Duration: 84 QT Interval:  373 QTC Calculation: 447 R Axis:   62 Text Interpretation:  Sinus rhythm Normal ECG No significant change since last tracing 15 Nov 2013 Confirmed by Devoria AlbeKnapp, Maxi Rodas ([id number]) on 07/02/2017 1:17:43 AM       Radiology No results found.  Procedures Procedures (including critical care time)  Medications Ordered in ED Medications  ketorolac (TORADOL) 30 MG/ML injection 30 mg (not administered)     Initial Impression / Assessment and Plan / ED Course  I have reviewed the triage vital signs and the nursing notes.  Pertinent labs & imaging results that were available during my care of the patient were reviewed by me and considered in my medical decision making  (see chart for details).     Patient has pain along her medial scapula consistent with a trigger point.  She was started on naproxen and Robaxin.  She can use ice and heat for comfort.  If that is not improving she should be evaluated for possible steroid injection in the area.  Final Clinical Impressions(s) / ED Diagnoses   Final diagnoses:  Shoulder blade pain  Trigger point of left side of body    ED Discharge Orders        Ordered    omeprazole (PRILOSEC) 20 MG capsule     07/02/17 0120    naproxen (NAPROSYN) 250 MG tablet     07/02/17 0120    methocarbamol (ROBAXIN) 500 MG tablet     07/02/17 0120     Plan discharge  Devoria AlbeIva Mirela Parsley, MD, Concha PyoFACEP    Neamiah Sciarra, MD 07/02/17 [phone]

## 2017-07-02 NOTE — ED Notes (Signed)
Gave EKG to Dr. Knapp  

## 2017-09-27 DIAGNOSIS — J45990 Exercise induced bronchospasm: Secondary | ICD-10-CM | POA: Insufficient documentation

## 2017-09-27 DIAGNOSIS — Z79899 Other long term (current) drug therapy: Secondary | ICD-10-CM | POA: Insufficient documentation

## 2017-09-27 DIAGNOSIS — F17210 Nicotine dependence, cigarettes, uncomplicated: Secondary | ICD-10-CM | POA: Insufficient documentation

## 2017-09-27 DIAGNOSIS — J029 Acute pharyngitis, unspecified: Secondary | ICD-10-CM | POA: Insufficient documentation

## 2017-09-28 ENCOUNTER — Encounter (HOSPITAL_COMMUNITY): Payer: Self-pay | Admitting: Emergency Medicine

## 2017-09-28 ENCOUNTER — Other Ambulatory Visit: Payer: Self-pay

## 2017-09-28 ENCOUNTER — Emergency Department (HOSPITAL_COMMUNITY)
Admission: EM | Admit: 2017-09-28 | Discharge: 2017-09-28 | Disposition: A | Discharge status: Home / Self Care | Payer: Self-pay | Attending: Emergency Medicine | Admitting: Emergency Medicine

## 2017-09-28 DIAGNOSIS — J029 Acute pharyngitis, unspecified: Secondary | ICD-10-CM

## 2017-09-28 MED ORDER — MAGIC MOUTHWASH W/LIDOCAINE: 5.0000 mL | Freq: Three times a day (TID) | ORAL | 0 refills | Status: DC | PRN

## 2017-09-28 MED ORDER — PENICILLIN G BENZATHINE 1200000 UNIT/2ML IM SUSP
INTRAMUSCULAR | Status: AC
  Filled 2017-09-28: qty 2

## 2017-09-28 MED ORDER — PENICILLIN G BENZATHINE & PROC 1200000 UNIT/2ML IM SUSP
1.2000 10*6.[IU] | Freq: Once | INTRAMUSCULAR | Status: DC
  Filled 2017-09-28: qty 2

## 2017-09-28 MED ORDER — PENICILLIN G BENZATHINE 1200000 UNIT/2ML IM SUSP
1.2000 10*6.[IU] | Freq: Once | INTRAMUSCULAR | Status: AC
  Administered 2017-09-28: 1.2 10*6.[IU] via INTRAMUSCULAR
  Filled 2017-09-28: qty 2

## 2017-09-28 NOTE — ED Provider Notes (Signed)
[facility] EMERGENCY DEPARTMENT Provider Note   CSN: [id number] Arrival date & time: 09/27/17  2322     History   Chief Complaint Chief Complaint  Patient presents with  . Sore Throat    HPI [name] Hampton is a 37 y.o. female.  HPI  [name] Hampton is a 37 y.o. female who presents to the Emergency Department complaining of sore throat, cough, nasal congestion and chills.  Symptoms began 6 days ago.  She states she has been taking over-the-counter Mucinex with no relief.  She states that she feels like her throat is "swollen"and she reports a cough that is persistent and nonproductive.  No recent sick contacts.  She denies shortness of breath, chest pain, abdominal pain, vomiting, or diarrhea.  Past Medical History:  Diagnosis Date  . Asthma    exercised induced  . FSHD (facioscapulohumeral muscular dystrophy)   . GERD (gastroesophageal reflux disease)   . Headache(784.0)   . Hx of chlamydia infection   . Hypoglycemia   . Muscular dystrophy   . PTSD (post-traumatic stress disorder)     Patient Active Problem List   Diagnosis Date Noted  . Chronic post-traumatic stress disorder (PTSD) 09/07/2014  . Abnormal uterine bleeding (AUB) 09/07/2014    Past Surgical History:  Procedure Laterality Date  . CESAREAN SECTION WITH BILATERAL TUBAL LIGATION  08/08/2012   x2 Procedure: CESAREAN SECTION WITH BILATERAL TUBAL LIGATION;  Surgeon: Tilda Burrow, MD;  Location: [facility];  Service: Obstetrics;  Laterality: N/A;  Repeat  . DIAGNOSTIC LAPAROSCOPY  1994  . DILITATION & CURRETTAGE/HYSTROSCOPY WITH THERMACHOICE ABLATION N/A 10/08/2012   Procedure: DILATATION & CURETTAGE/HYSTEROSCOPY WITH THERMACHOICE ABLATION;  Surgeon: Tilda Burrow, MD;  Location: [facility];  Service: Gynecology;  Laterality: N/A;  total therapy time- 9 minutes 34 seconds; temp- 83-86 degrees farhernheit  . TONSILLECTOMY    . TUBAL LIGATION      OB History    Gravida  2   Para  2   Term  1   Preterm      AB       Living  2     SAB      TAB      Ectopic      Multiple      Live Births  1            Home Medications    Prior to Admission medications   Medication Sig Start Date End Date Taking? Authorizing Provider  acetaminophen (TYLENOL) 500 MG tablet Take 1,000 mg by mouth every 6 (six) hours as needed for pain.     [provider]  buPROPion (WELLBUTRIN) 100 MG tablet Take 50 mg by mouth daily.    [provider]  FLUoxetine (PROZAC) 40 MG capsule Take 40 mg by mouth daily.    [provider]  HYDROcodone-acetaminophen (NORCO) 5-325 MG per tablet Take 1 tablet by mouth every 6 (six) hours as needed for moderate pain. Patient not taking: Reported on 07/27/2014 05/29/14   Janne Napoleon, NP  hydrOXYzine (VISTARIL) 25 MG capsule Take 25 mg by mouth 2 (two) times daily.    [provider]  LORazepam (ATIVAN) 1 MG tablet Take 1 mg by mouth every 6 (six) hours as needed for anxiety.    [provider]  methocarbamol (ROBAXIN) 500 MG tablet Take 1 or 2 po Q 6hrs for muscle pain 07/02/17   Devoria Albe, MD  naproxen (NAPROSYN) 250 MG tablet Take 1 po  BID with food prn pain 07/02/17   Devoria Albe, MD  omeprazole (PRILOSEC) 20 MG capsule Take 1 po BID x 2 weeks then once a day 07/02/17   Devoria Albe, MD  traZODone (DESYREL) 50 MG tablet Take 50 mg by mouth at bedtime.    [provider]    Family History Family History  Problem Relation Age of Onset  . Emphysema Mother   . Muscular dystrophy Mother   . Diabetes Father   . Hypertension Father   . Hyperlipidemia Father   . Fibromyalgia Sister   . Muscular dystrophy Brother   . Muscular dystrophy Sister   . Cancer Other     Social History Social History   Tobacco Use  . Smoking status: Current Every Day Smoker    Packs/day: 0.25    Years: 16.00    Pack years: 4.00    Types: Cigarettes  . Smokeless tobacco: Never Used  . Tobacco comment: 3 cigarrettes per day   Substance Use  Topics  . Alcohol use: Yes    Alcohol/week: 4.2 oz    Types: 3 Glasses of wine, 4 Cans of beer per week    Comment: occassional  . Drug use: No     Allergies   Peppermint oil and Erythromycin   Review of Systems Review of Systems  Constitutional: Positive for chills. Negative for activity change, appetite change and fever.  HENT: Positive for congestion, sinus pressure, sinus pain and sore throat. Negative for ear pain, facial swelling, trouble swallowing and voice change.   Eyes: Negative for pain and visual disturbance.  Respiratory: Positive for cough. Negative for chest tightness and shortness of breath.   Cardiovascular: Negative for chest pain.  Gastrointestinal: Negative for abdominal pain, nausea and vomiting.  Musculoskeletal: Negative for arthralgias, neck pain and neck stiffness.  Skin: Negative for color change and rash.  Neurological: Negative for dizziness, facial asymmetry, speech difficulty, numbness and headaches.  Hematological: Negative for adenopathy.  All other systems reviewed and are negative.    Physical Exam Updated Vital Signs BP 124/81   Pulse 98   Temp 97.9 F (36.6 C) (Oral)   Resp 18   Ht 5\' 1"  (1.549 m)   Wt 83.9 kg (185 lb)   LMP 09/09/2017   SpO2 98%   BMI 34.96 kg/m   Physical Exam  Constitutional: She is oriented to person, place, and time. She appears well-developed and well-nourished. No distress.  HENT:  Head: Normocephalic and atraumatic.  Right Ear: Tympanic membrane and ear canal normal.  Left Ear: Tympanic membrane and ear canal normal.  Mouth/Throat: Uvula is midline and mucous membranes are normal. No trismus in the jaw. No uvula swelling. Posterior oropharyngeal edema and posterior oropharyngeal erythema present. No oropharyngeal exudate or tonsillar abscesses.  Neck: Normal range of motion. Neck supple.  Cardiovascular: Normal rate and regular rhythm.  Pulmonary/Chest: Effort normal and breath sounds normal. No  respiratory distress. She has no wheezes. She has no rales.  Abdominal: Soft. There is no splenomegaly. There is no tenderness.  Musculoskeletal: Normal range of motion.  Lymphadenopathy:    She has cervical adenopathy.  Neurological: She is alert and oriented to person, place, and time. She exhibits normal muscle tone. Coordination normal.  Skin: Skin is warm and dry. Capillary refill takes less than 2 seconds.  Nursing note and vitals reviewed.    ED Treatments / Results  Labs (all labs ordered are listed, but only abnormal results are displayed) Labs Reviewed - No  data to display  EKG  EKG Interpretation None       Radiology No results found.  Procedures Procedures (including critical care time)  Medications Ordered in ED Medications  penicillin g benzathine (BICILLIN LA) 1200000 UNIT/2ML injection 1.2 Million Units (has no administration in time range)     Initial Impression / Assessment and Plan / ED Course  I have reviewed the triage vital signs and the nursing notes.  Pertinent labs & imaging results that were available during my care of the patient were reviewed by me and considered in my medical decision making (see chart for details).     Patient well-appearing.  Airways patent.  No edema or concerning symptoms for peritonsillar abscess. Patient agrees to treatment plan with IM Bicillin.  Prescription written for Magic mouthwash.  Patient agrees to Tylenol ibuprofen if needed for pain.  Return precautions were discussed  Final Clinical Impressions(s) / ED Diagnoses   Final diagnoses:  Pharyngitis, unspecified etiology    ED Discharge Orders    None       Pauline Ausriplett, Jaaziah Schulke, PA-C 09/28/17 0123    Devoria AlbeKnapp, Iva, MD 09/28/17 [phone]

## 2017-09-28 NOTE — Discharge Instructions (Addendum)
Drink plenty of fluids.  Tylenol every 4 hours if needed for fever or pain.  Follow-up with your primary doctor for recheck

## 2017-09-28 NOTE — ED Triage Notes (Signed)
Pt c/o sore throat, head congestion, and cough x 6 days. Pt has been taking mucinex with no relief.

## 2017-10-22 IMAGING — DX DG WRIST COMPLETE 3+V*R*
4 series · 4 of 4 positions shown · non-contrast
Comparison: 11/15/2013

CLINICAL DATA: Patient woke up with right wrist pain. No known
injury.

EXAM:
RIGHT WRIST - COMPLETE 3+ VIEW

[wrist pa]
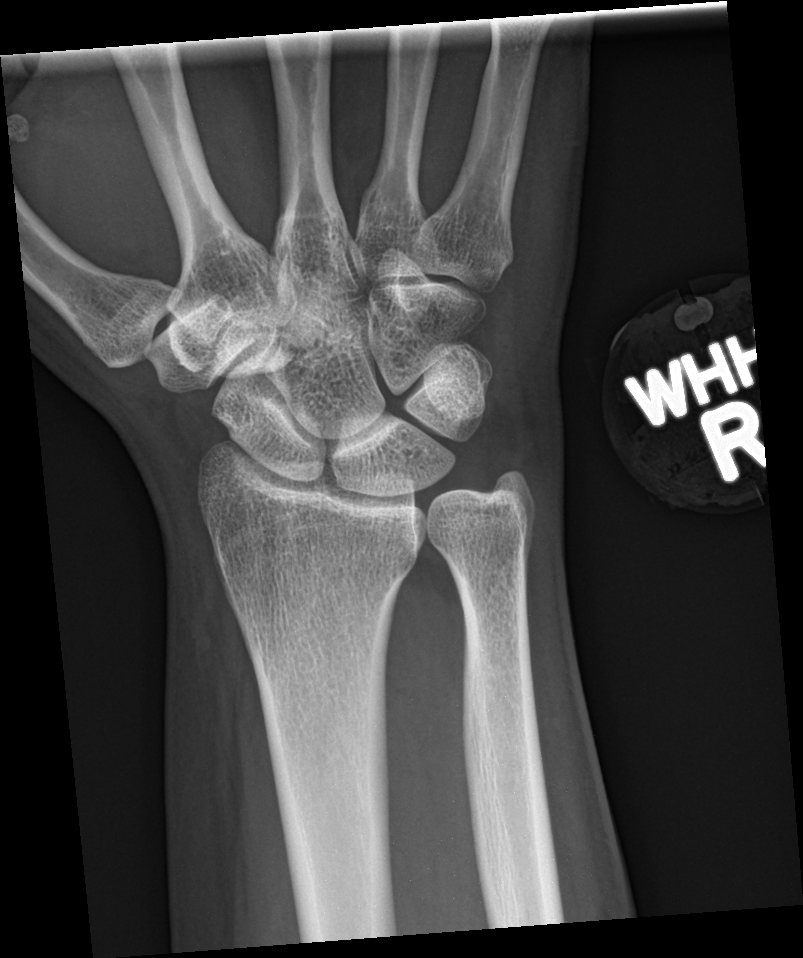

[wrist navicular]
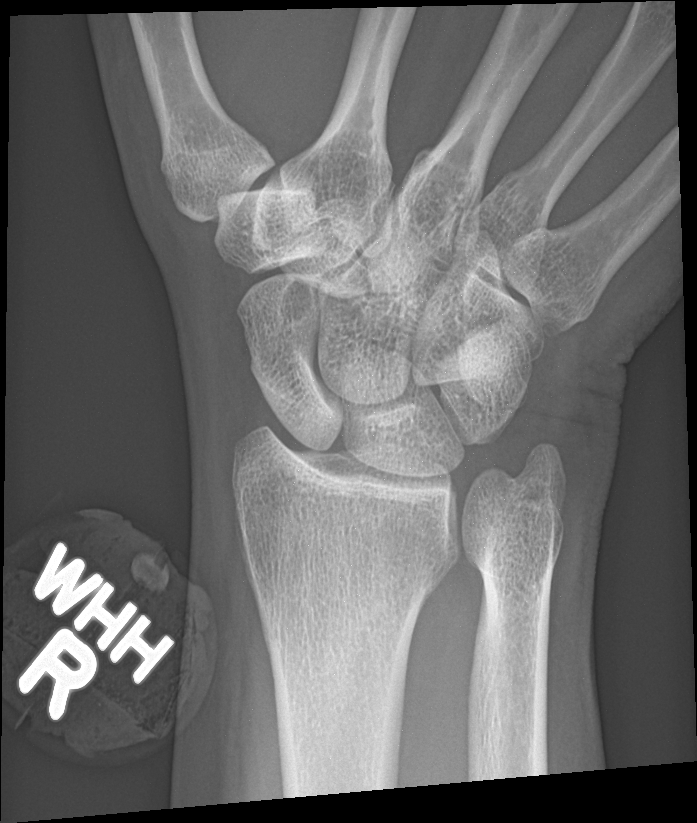

[wrist obl]
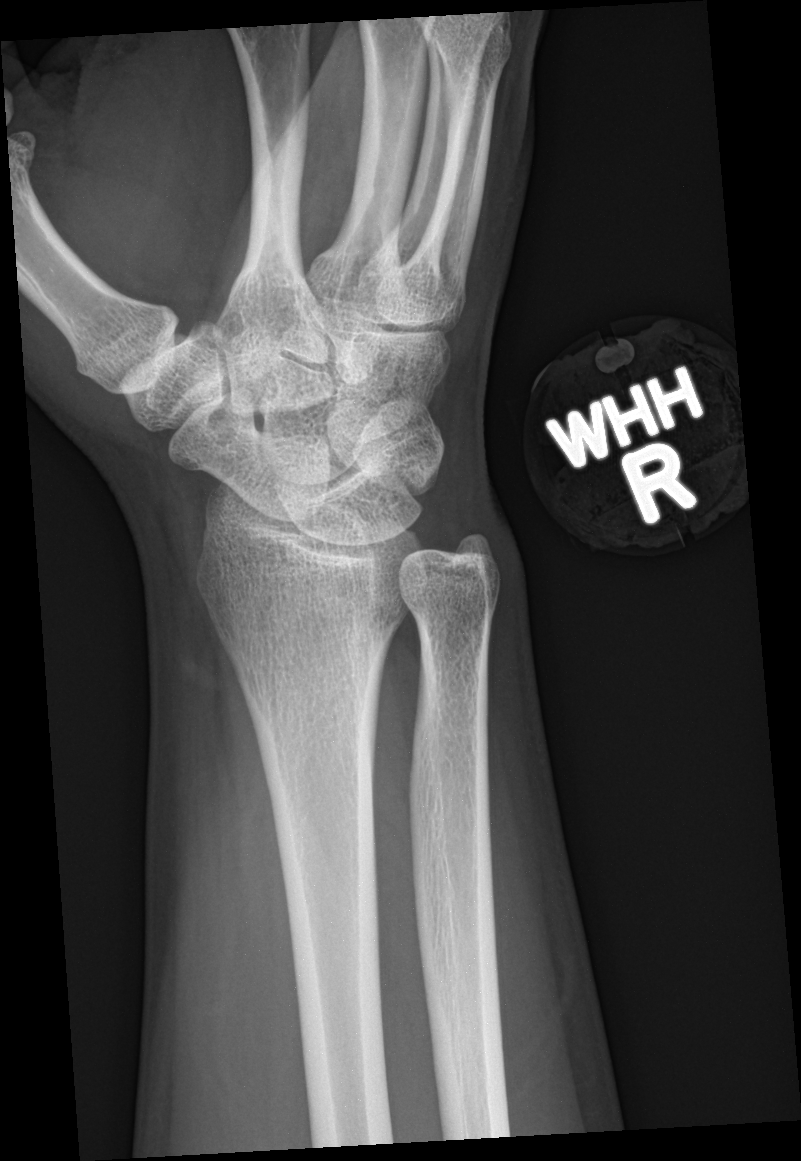

[wrist lat]
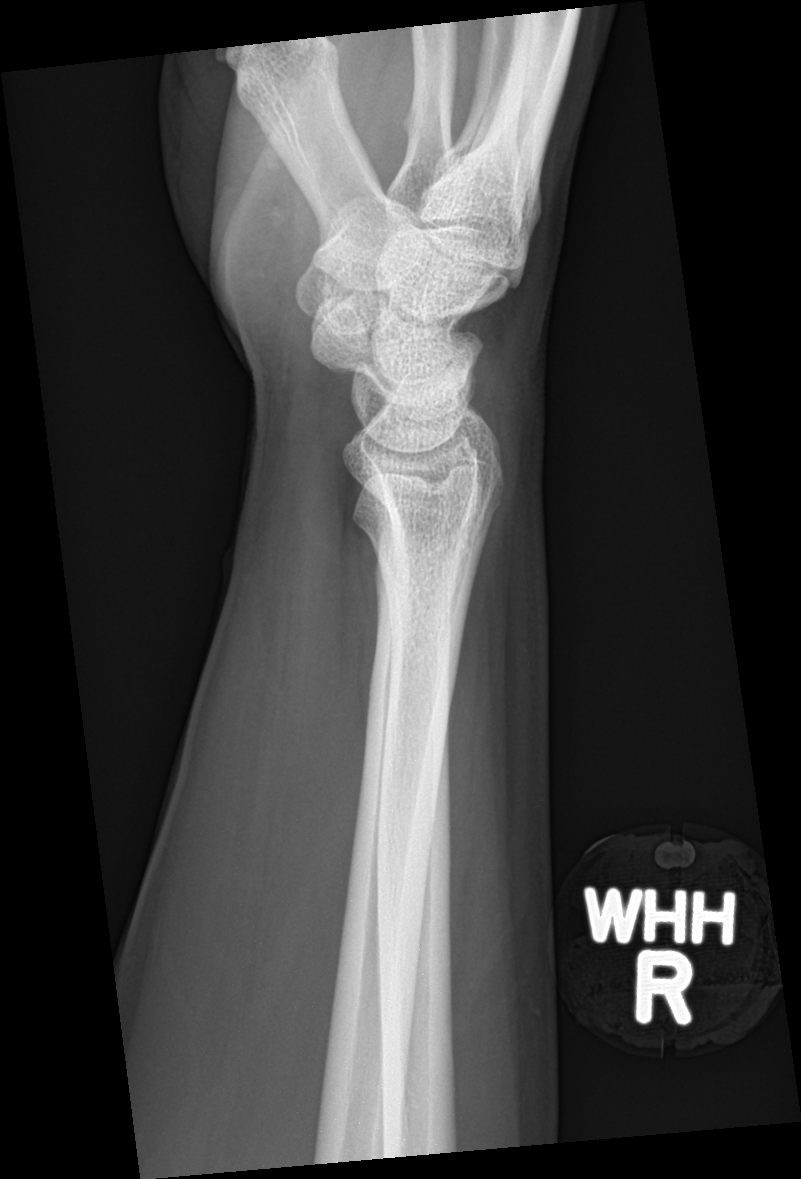

[4 of 4 positions shown; findings below may reference images not displayed]

FINDINGS: Benign-appearing bones cyst in the distal scaphoid bone, unchanged
since prior study. No evidence of acute fracture or dislocation. No
expansile or destructive bone lesions. Soft tissues are
unremarkable.
IMPRESSION: No acute bony abnormalities.

## 2017-10-22 IMAGING — DX DG FINGER THUMB 2+V*R*
3 series · 3 of 3 positions shown · non-contrast
Comparison: Right wrist 11/15/2013

CLINICAL DATA: Pain in the right first finger.  No known injury.

EXAM:
RIGHT THUMB 2+V

[finger ap]
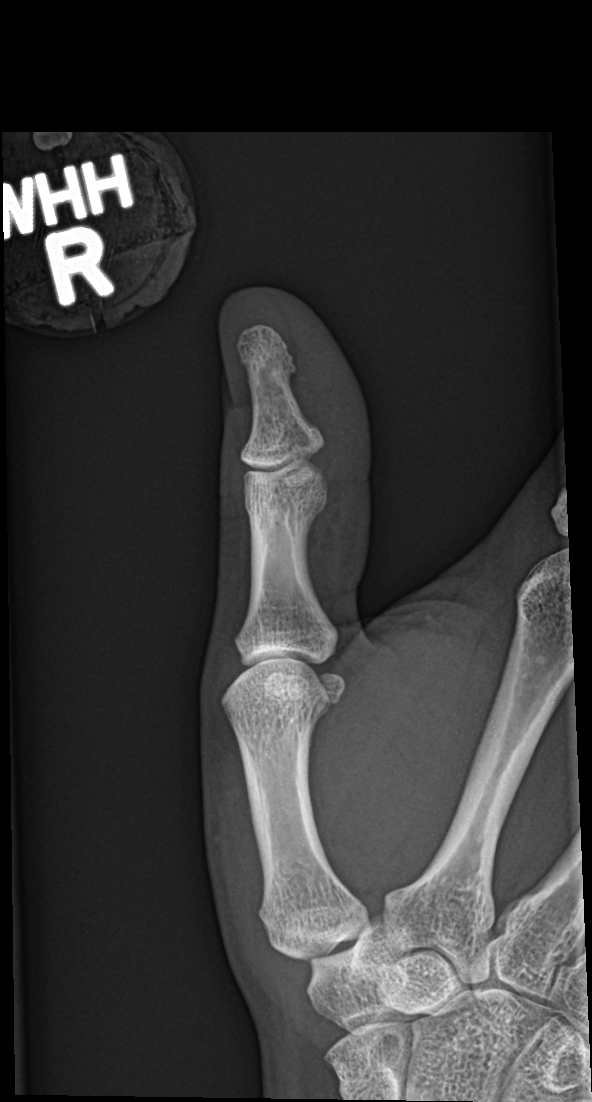

[finger obl]
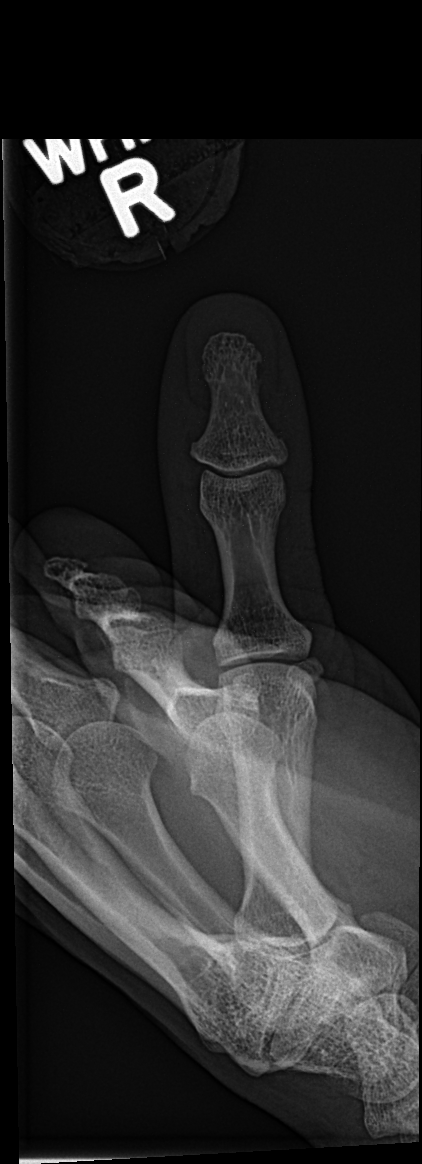

[finger lat]
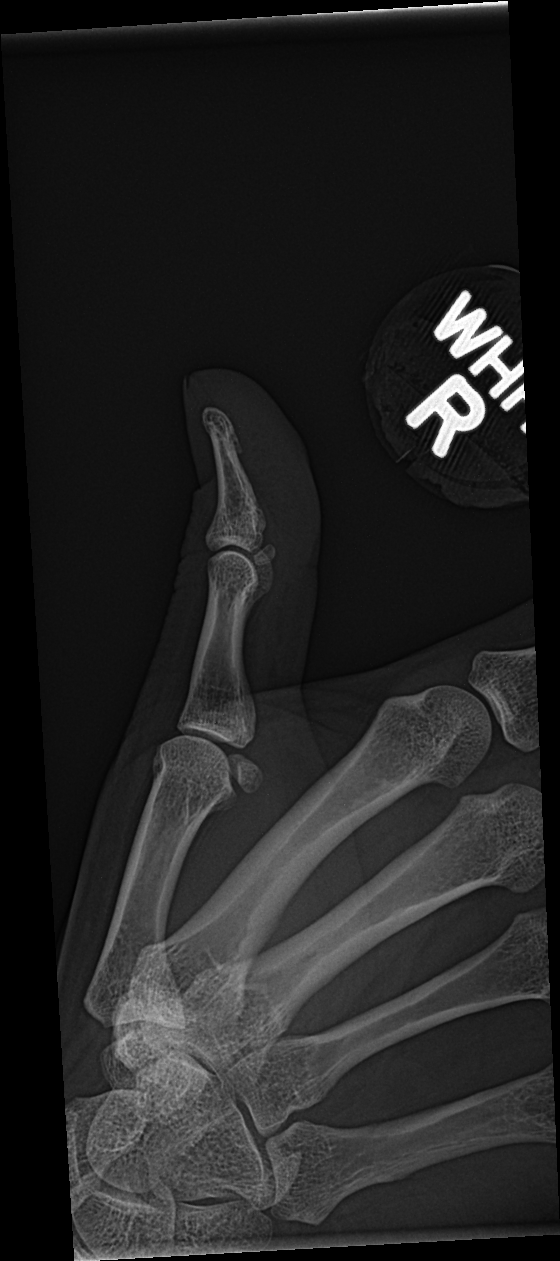

[3 of 3 positions shown; findings below may reference images not displayed]

FINDINGS: There is no evidence of fracture or dislocation. There is no
evidence of arthropathy or other focal bone abnormality. Soft
tissues are unremarkable
IMPRESSION: Negative.

## 2023-10-23 ENCOUNTER — Emergency Department (HOSPITAL_COMMUNITY)
Admission: EM | Admit: 2023-10-23 | Discharge: 2023-10-23 | Disposition: A | Discharge status: Home / Self Care | Payer: Self-pay | Attending: Emergency Medicine | Admitting: Emergency Medicine

## 2023-10-23 ENCOUNTER — Emergency Department (HOSPITAL_COMMUNITY): Payer: Self-pay

## 2023-10-23 ENCOUNTER — Other Ambulatory Visit: Payer: Self-pay

## 2023-10-23 ENCOUNTER — Encounter (HOSPITAL_COMMUNITY): Payer: Self-pay | Admitting: Emergency Medicine

## 2023-10-23 DIAGNOSIS — Z79899 Other long term (current) drug therapy: Secondary | ICD-10-CM | POA: Insufficient documentation

## 2023-10-23 DIAGNOSIS — R000 Tachycardia, unspecified: Secondary | ICD-10-CM | POA: Insufficient documentation

## 2023-10-23 DIAGNOSIS — R0789 Other chest pain: Secondary | ICD-10-CM | POA: Insufficient documentation

## 2023-10-23 LAB — CBC
HCT: 42.5 % (ref 36.0–46.0)
Hemoglobin: 14.8 g/dL (ref 12.0–15.0)
MCH: 31 pg (ref 26.0–34.0)
MCHC: 34.8 g/dL (ref 30.0–36.0)
MCV: 88.9 fL (ref 80.0–100.0)
Platelets: 378 10*3/uL (ref 150–400)
RBC: 4.78 MIL/uL (ref 3.87–5.11)
RDW: 11.7 % (ref 11.5–15.5)
WBC: 9.4 10*3/uL (ref 4.0–10.5)
nRBC: 0 % (ref 0.0–0.2)

## 2023-10-23 LAB — BASIC METABOLIC PANEL WITH GFR
Anion gap: 12 (ref 5–15)
BUN: 12 mg/dL (ref 6–20)
CO2: 23 mmol/L (ref 22–32)
Calcium: 9.2 mg/dL (ref 8.9–10.3)
Chloride: 104 mmol/L (ref 98–111)
Creatinine, Ser: 0.84 mg/dL (ref 0.44–1.00)
GFR, Estimated: >60 mL/min (ref >60)
Glucose, Bld: 120 mg/dL — ABNORMAL HIGH (ref 70–99)
Potassium: 3.3 mmol/L — ABNORMAL LOW (ref 3.5–5.1)
Sodium: 139 mmol/L (ref 135–145)

## 2023-10-23 LAB — TROPONIN I (HIGH SENSITIVITY): Troponin I (High Sensitivity): 3 ng/L (ref <18)

## 2023-10-23 LAB — D-DIMER, QUANTITATIVE: D-Dimer, Quant: <0.27 ug{FEU}/mL (ref 0.00–0.50)

## 2023-10-23 NOTE — ED Notes (Signed)
 Pt states chest pain feels like something is weighing on her chest, states she feels extremely tired today & that isn't common for her.

## 2023-10-23 NOTE — ED Notes (Signed)
 X-ray at bedside

## 2023-10-23 NOTE — Discharge Instructions (Signed)
 Begin taking ibuprofen 600 mg every 6 hours as needed for pain.  Rest.  Return to the ER if your symptoms significantly worsen or change.

## 2023-10-23 NOTE — ED Triage Notes (Signed)
 Pt c/o intermittent chest pain since Saturday with fatigue.

## 2023-10-23 NOTE — ED Provider Notes (Signed)
 [place] EMERGENCY DEPARTMENT AT [facility] Provider Note   CSN: [id number] Arrival date & time: 10/23/23  0117     History  Chief Complaint  Patient presents with   Chest Pain    [name] Hampton is a 43 y.o. female.  Patient is a 43 year old female presenting with complaints of chest discomfort.  She describes a pressure to the center of her chest that has been present for the past 3 days.  She denies any shortness of breath, nausea, diaphoresis, or radiation to the arm or jaw.  No recent exertional symptoms.  Patient has no cardiac risk factors.       Home Medications Prior to Admission medications   Medication Sig Start Date End Date Taking? Authorizing Provider  acetaminophen (TYLENOL) 500 MG tablet Take 1,000 mg by mouth every 6 (six) hours as needed for pain.     [provider]  buPROPion (WELLBUTRIN) 100 MG tablet Take 50 mg by mouth daily.    [provider]  FLUoxetine (PROZAC) 40 MG capsule Take 40 mg by mouth daily.    [provider]  HYDROcodone-acetaminophen (NORCO) 5-325 MG per tablet Take 1 tablet by mouth every 6 (six) hours as needed for moderate pain. Patient not taking: Reported on 07/27/2014 05/29/14   Janne Napoleon, NP  hydrOXYzine (VISTARIL) 25 MG capsule Take 25 mg by mouth 2 (two) times daily.    [provider]  LORazepam (ATIVAN) 1 MG tablet Take 1 mg by mouth every 6 (six) hours as needed for anxiety.    [provider]  magic mouthwash w/lidocaine SOLN Take 5 mLs by mouth 3 (three) times daily as needed for mouth pain. Swish and spit, do not swallow 09/28/17   Triplett, Tammy, PA-C  methocarbamol (ROBAXIN) 500 MG tablet Take 1 or 2 po Q 6hrs for muscle pain 07/02/17   Devoria Albe, MD  naproxen (NAPROSYN) 250 MG tablet Take 1 po BID with food prn pain 07/02/17   Devoria Albe, MD  omeprazole (PRILOSEC) 20 MG capsule Take 1 po BID x 2 weeks then once a day 07/02/17   Devoria Albe, MD  traZODone (DESYREL)  50 MG tablet Take 50 mg by mouth at bedtime.    [provider]      Allergies    Peppermint oil, Erythromycin, and Shellfish allergy    Review of Systems   Review of Systems  All other systems reviewed and are negative.   Physical Exam Updated Vital Signs BP 121/76   Pulse 90   Temp 98 F (36.7 C)   Resp 15   Ht 5\' 1"  (1.549 m)   Wt 84 kg   SpO2 97%   BMI 34.99 kg/m  Physical Exam Vitals and nursing note reviewed.  Constitutional:      General: She is not in acute distress.    Appearance: She is well-developed. She is not diaphoretic.  HENT:     Head: Normocephalic and atraumatic.  Cardiovascular:     Rate and Rhythm: Normal rate and regular rhythm.     Heart sounds: No murmur heard.    No friction rub. No gallop.  Pulmonary:     Effort: Pulmonary effort is normal. No respiratory distress.     Breath sounds: Normal breath sounds. No wheezing.  Abdominal:     General: Bowel sounds are normal. There is no distension.     Palpations: Abdomen is soft.     Tenderness: There is no abdominal tenderness.  Musculoskeletal:        General: Normal range of motion.     Cervical back: Normal range of motion and neck supple.     Right lower leg: No tenderness. No edema.     Left lower leg: No tenderness. No edema.  Skin:    General: Skin is warm and dry.  Neurological:     General: No focal deficit present.     Mental Status: She is alert and oriented to person, place, and time.     ED Results / Procedures / Treatments   Labs (all labs ordered are listed, but only abnormal results are displayed) Labs Reviewed  BASIC METABOLIC PANEL WITH GFR - Abnormal; Notable for the following components:      Result Value   Potassium 3.3 (*)    Glucose, Bld 120 (*)    All other components within normal limits  CBC  D-DIMER, QUANTITATIVE  TROPONIN I (HIGH SENSITIVITY)    EKG EKG Interpretation Date/Time:  Tuesday October 23 2023 01:27:39 EDT Ventricular Rate:  108 PR  Interval:    QRS Duration:  91 QT Interval:  338 QTC Calculation: 453 R Axis:   76  Text Interpretation: Sinus tachycardia No significant change since 07/02/2017 Confirmed by Orvilla Blander ([id number]) on 10/23/2023 2:00:42 AM  Radiology DG Chest Port 1 View Result Date: 10/23/2023 CLINICAL DATA:  Chest pain EXAM: PORTABLE CHEST 1 VIEW COMPARISON:  None Available. FINDINGS: The heart size and mediastinal contours are within normal limits. Both lungs are clear. The visualized skeletal structures are unremarkable. IMPRESSION: No active disease. Electronically Signed   By: Violeta Grey M.D.   On: 10/23/2023 02:35    Procedures Procedures    Medications Ordered in ED Medications - No data to display  ED Course/ Medical Decision Making/ A&P  Patient presenting with a several day history of chest pain as described in the HPI.  She arrives here with stable vital signs and is afebrile.  There is no hypoxia.  Physical examination basically unremarkable.  Laboratory studies obtained including CBC, metabolic panel, troponin, and D-dimer.  All studies are basically normal.  Chest x-ray obtained showing no acute process.  Patient having atypical symptoms for 3 days with negative troponin and unremarkable workup.  She seems stable for discharge.  Cause of her chest pain unclear, but highly doubt a cardiac etiology, doubt pulmonary embolism, and doubt aortic dissection.  I feel as though patient can safely be discharged with rest, NSAIDs, and follow-up as needed.  Final Clinical Impression(s) / ED Diagnoses Final diagnoses:  None    Rx / DC Orders ED Discharge Orders     None         Orvilla Blander, MD 10/23/23 [phone]

## 2023-10-29 ENCOUNTER — Emergency Department (HOSPITAL_COMMUNITY): Payer: Self-pay

## 2023-10-29 ENCOUNTER — Other Ambulatory Visit: Payer: Self-pay

## 2023-10-29 ENCOUNTER — Encounter (HOSPITAL_COMMUNITY): Payer: Self-pay | Admitting: *Deleted

## 2023-10-29 ENCOUNTER — Emergency Department (HOSPITAL_COMMUNITY)
Admission: EM | Admit: 2023-10-29 | Discharge: 2023-10-29 | Disposition: A | Discharge status: Home / Self Care | Payer: Self-pay

## 2023-10-29 DIAGNOSIS — R0789 Other chest pain: Secondary | ICD-10-CM | POA: Insufficient documentation

## 2023-10-29 LAB — BASIC METABOLIC PANEL WITH GFR
Anion gap: 11 (ref 5–15)
BUN: 8 mg/dL (ref 6–20)
CO2: 19 mmol/L — ABNORMAL LOW (ref 22–32)
Calcium: 9.7 mg/dL (ref 8.9–10.3)
Chloride: 105 mmol/L (ref 98–111)
Creatinine, Ser: 0.87 mg/dL (ref 0.44–1.00)
GFR, Estimated: >60 mL/min (ref >60)
Glucose, Bld: 103 mg/dL — ABNORMAL HIGH (ref 70–99)
Potassium: 3.6 mmol/L (ref 3.5–5.1)
Sodium: 135 mmol/L (ref 135–145)

## 2023-10-29 LAB — CBC
HCT: 41.6 % (ref 36.0–46.0)
Hemoglobin: 14.7 g/dL (ref 12.0–15.0)
MCH: 30.8 pg (ref 26.0–34.0)
MCHC: 35.3 g/dL (ref 30.0–36.0)
MCV: 87.2 fL (ref 80.0–100.0)
Platelets: 348 10*3/uL (ref 150–400)
RBC: 4.77 MIL/uL (ref 3.87–5.11)
RDW: 11.6 % (ref 11.5–15.5)
WBC: 8.3 10*3/uL (ref 4.0–10.5)
nRBC: 0 % (ref 0.0–0.2)

## 2023-10-29 LAB — URINALYSIS, ROUTINE W REFLEX MICROSCOPIC
Bilirubin Urine: NEGATIVE
Glucose, UA: NEGATIVE mg/dL
Ketones, ur: NEGATIVE mg/dL
Leukocytes,Ua: NEGATIVE
Nitrite: NEGATIVE
Protein, ur: NEGATIVE mg/dL
Specific Gravity, Urine: 1.01 (ref 1.005–1.030)
pH: 7 (ref 5.0–8.0)

## 2023-10-29 LAB — TROPONIN I (HIGH SENSITIVITY)
Troponin I (High Sensitivity): <2 ng/L (ref <18)
Troponin I (High Sensitivity): <2 ng/L (ref <18)

## 2023-10-29 LAB — HCG, SERUM, QUALITATIVE: Preg, Serum: NEGATIVE

## 2023-10-29 LAB — D-DIMER, QUANTITATIVE: D-Dimer, Quant: <0.27 ug{FEU}/mL (ref 0.00–0.50)

## 2023-10-29 MED ORDER — KETOROLAC TROMETHAMINE 15 MG/ML IJ SOLN
15.0000 mg | Freq: Once | INTRAMUSCULAR | Status: AC
  Administered 2023-10-29: 15 mg via INTRAVENOUS
  Filled 2023-10-29: qty 1

## 2023-10-29 MED ORDER — LACTATED RINGERS IV BOLUS
1000.0000 mL | Freq: Once | INTRAVENOUS | Status: AC
  Administered 2023-10-29: 1000 mL via INTRAVENOUS

## 2023-10-29 MED ORDER — IPRATROPIUM-ALBUTEROL 0.5-2.5 (3) MG/3ML IN SOLN
3.0000 mL | Freq: Once | RESPIRATORY_TRACT | Status: AC
  Administered 2023-10-29: 3 mL via RESPIRATORY_TRACT
  Filled 2023-10-29: qty 3

## 2023-10-29 NOTE — ED Notes (Signed)
 Pt ambulated up and down the hallway. O2 sats stayed in the upper 90s. Pt only reports feeling dizzy but no nausea

## 2023-10-29 NOTE — Discharge Instructions (Signed)
 You were Today was reassuring.  I have placed a consult to our cardiologist try to get you in to be seen for further evaluation as an outpatient.  Please follow-up as scheduled.  Return to the ER for worsening symptoms.

## 2023-10-29 NOTE — ED Provider Notes (Signed)
 [place] EMERGENCY DEPARTMENT AT [facility] Provider Note   CSN: [id number] Arrival date & time: 10/29/23  1533     History  Chief Complaint  Patient presents with   Chest Pain    [name] Hampton is a 43 y.o. female.  43 year old female with no reported past medical history presenting to the emergency department today with chest tightness and shortness of breath.  The patient states that this been going on now for the past week or so.  The patient states that the pressure is in the center of her chest does not radiate.  She states that she has had occasional lightheadedness as well.  She was evaluated in the emergency department last week and had a favorable workup.  She has tried some NSAIDs which do not seem to help.  She came back to the ER for further evaluation.  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  Denies any cough.  The patient denies any hemoptysis.  Denies any leg pain or swelling.  She is not on any estrogen-containing medications.   Chest Pain      Home Medications Prior to Admission medications   Medication Sig Start Date End Date Taking? Authorizing Provider  acetaminophen  (TYLENOL ) 500 MG tablet Take 1,000 mg by mouth every 6 (six) hours as needed for pain.     [provider]  buPROPion (WELLBUTRIN) 100 MG tablet Take 50 mg by mouth daily.    [provider]  FLUoxetine (PROZAC) 40 MG capsule Take 40 mg by mouth daily.    [provider]  HYDROcodone -acetaminophen  (NORCO) 5-325 MG per tablet Take 1 tablet by mouth every 6 (six) hours as needed for moderate pain. Patient not taking: Reported on 07/27/2014 05/29/14   Hardie Leyland, NP  hydrOXYzine (VISTARIL) 25 MG capsule Take 25 mg by mouth 2 (two) times daily.    [provider]  LORazepam  (ATIVAN ) 1 MG tablet Take 1 mg by mouth every 6 (six) hours as needed for anxiety.    [provider]  magic mouthwash w/lidocaine  SOLN Take 5 mLs by mouth 3  (three) times daily as needed for mouth pain. Swish and spit, do not swallow 09/28/17   Triplett, Tammy, PA-C  methocarbamol  (ROBAXIN ) 500 MG tablet Take 1 or 2 po Q 6hrs for muscle pain 07/02/17   Knapp, Iva, MD  naproxen  (NAPROSYN ) 250 MG tablet Take 1 po BID with food prn pain 07/02/17   Knapp, Iva, MD  omeprazole  (PRILOSEC) 20 MG capsule Take 1 po BID x 2 weeks then once a day 07/02/17   Knapp, Iva, MD  traZODone (DESYREL) 50 MG tablet Take 50 mg by mouth at bedtime.    [provider]      Allergies    Peppermint oil, Erythromycin, and Shellfish allergy    Review of Systems   Review of Systems  Cardiovascular:  Positive for chest pain.  All other systems reviewed and are negative.   Physical Exam Updated Vital Signs BP 104/62   Pulse 67   Temp 98.4 F (36.9 C) (Oral)   Resp 16   Ht 5\' 1"  (1.549 m)   Wt 84 kg   SpO2 100%   BMI 34.99 kg/m  Physical Exam Vitals and nursing note reviewed.   Gen: NAD Eyes: PERRL, EOMI HEENT: no oropharyngeal swelling Neck: trachea midline Resp: clear to auscultation bilaterally Card: RRR, no murmurs, rubs, or gallops Abd: nontender, nondistended Extremities: no calf tenderness, no edema Vascular:  2+ radial pulses bilaterally, 2+ DP pulses bilaterally Neuro: no focal deficits Skin: no rashes Psyc: acting appropriately   ED Results / Procedures / Treatments   Labs (all labs ordered are listed, but only abnormal results are displayed) Labs Reviewed  BASIC METABOLIC PANEL WITH GFR - Abnormal; Notable for the following components:      Result Value   CO2 19 (*)    Glucose, Bld 103 (*)    All other components within normal limits  URINALYSIS, ROUTINE W REFLEX MICROSCOPIC - Abnormal; Notable for the following components:   APPearance HAZY (*)    Hgb urine dipstick MODERATE (*)    Bacteria, UA FEW (*)    All other components within normal limits  CBC  D-DIMER, QUANTITATIVE  HCG, SERUM, QUALITATIVE  TROPONIN I (HIGH  SENSITIVITY)  TROPONIN I (HIGH SENSITIVITY)    EKG None  Radiology DG Chest Port 1 View Result Date: 10/29/2023 CLINICAL DATA:  Shortness of breath EXAM: PORTABLE CHEST 1 VIEW COMPARISON:  Chest x-ray 10/23/2023 FINDINGS: The heart size and mediastinal contours are within normal limits. Both lungs are clear. The visualized skeletal structures are unremarkable. IMPRESSION: No active disease. Electronically Signed   By: Tyron Gallon M.D.   On: 10/29/2023 20:04    Procedures Procedures    Medications Ordered in ED Medications  lactated ringers  bolus 1,000 mL (0 mLs Intravenous Stopped 10/29/23 1729)  ipratropium-albuterol  (DUONEB) 0.5-2.5 (3) MG/3ML nebulizer solution 3 mL (3 mLs Nebulization Given 10/29/23 1617)  ketorolac  (TORADOL ) 15 MG/ML injection 15 mg (15 mg Intravenous Given 10/29/23 2003)    ED Course/ Medical Decision Making/ A&P                                 Medical Decision Making 43 year old female with past medical history of exercise-induced asthma presenting to the emergency department today with chest tightness and dyspnea on exertion.  I will further evaluate the patient here with basic labs to evaluate for anemia or electrolyte abnormalities.  Will keep the patient on the monitor here.  I will obtain a chest x-ray to evaluate for pneumonia, pulmonary edema, or pneumothorax.  Will obtain a D-dimer to evaluate for pulmonary embolism.  The patient's initial blood pressure is 95 systolic.  Will give her IV fluids as well as a DuoNeb.  The patient's vital signs are otherwise unremarkable.  I will reevaluate for ultimate disposition.  Doubt this is secondary to aortic dissection given description of her symptoms and reassuring neurovascular exam.  The patient's labs here are reassuring.  She had 2 troponins that were negative and D-dimer is negative.  Chest x-ray is unremarkable.  She is discharged with cardiology follow-up with return precautions.  Amount and/or Complexity  of Data Reviewed Labs: ordered. Radiology: ordered.  Risk Prescription drug management.           Final Clinical Impression(s) / ED Diagnoses Final diagnoses:  Chest pressure    Rx / DC Orders ED Discharge Orders          Ordered    Ambulatory referral to Cardiology       Comments: If you have not heard from the Cardiology office within the next 72 hours please call [phone].   10/29/23 2100              Carin Charleston, MD 10/29/23 2101

## 2023-10-29 NOTE — ED Triage Notes (Signed)
 Pt c/o chest pain that started two weeks ago, worse with exertion.

## 2023-10-31 ENCOUNTER — Other Ambulatory Visit: Payer: Self-pay

## 2023-10-31 ENCOUNTER — Ambulatory Visit: Payer: Self-pay

## 2023-10-31 ENCOUNTER — Encounter: Payer: Self-pay | Admitting: Emergency Medicine

## 2023-10-31 VITALS — BP 118/72 | HR 72 | Ht 61.6 in | Wt 167.6 lb

## 2023-10-31 DIAGNOSIS — G7102 Facioscapulohumeral muscular dystrophy: Secondary | ICD-10-CM | POA: Insufficient documentation

## 2023-10-31 DIAGNOSIS — R079 Chest pain, unspecified: Secondary | ICD-10-CM

## 2023-10-31 DIAGNOSIS — F4310 Post-traumatic stress disorder, unspecified: Secondary | ICD-10-CM | POA: Insufficient documentation

## 2023-10-31 DIAGNOSIS — G7100 Muscular dystrophy, unspecified: Secondary | ICD-10-CM | POA: Insufficient documentation

## 2023-10-31 DIAGNOSIS — R0789 Other chest pain: Secondary | ICD-10-CM | POA: Insufficient documentation

## 2023-10-31 DIAGNOSIS — Z8619 Personal history of other infectious and parasitic diseases: Secondary | ICD-10-CM | POA: Insufficient documentation

## 2023-10-31 DIAGNOSIS — K219 Gastro-esophageal reflux disease without esophagitis: Secondary | ICD-10-CM | POA: Insufficient documentation

## 2023-10-31 DIAGNOSIS — E162 Hypoglycemia, unspecified: Secondary | ICD-10-CM | POA: Insufficient documentation

## 2023-10-31 HISTORY — DX: Chest pain, unspecified: R07.9

## 2023-10-31 MED ORDER — METOPROLOL TARTRATE 50 MG PO TABS: ORAL_TABLET | ORAL | 0 refills | Status: DC

## 2023-10-31 NOTE — Assessment & Plan Note (Signed)
 Atypical symptoms of chest pain on and off for the last 2 weeks. 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 Couple ER visits in the past 2 weeks.  Advised her to start taking aspirin 81 mg once daily. Will further evaluate cardiac function with a transthoracic echocardiogram. Will also assess for any significant coronary artery disease.  Given her functional limitations and balance issues at this time and sense of lack of energy and tiredness we will hold off on stress test and proceed with cardiac CT coronary angiogram. Will request schedule these expedited. Follow-up in the office once we have these test results back.  I will also request a thyroid panel to be completed

## 2023-10-31 NOTE — Patient Instructions (Addendum)
 Medication Instructions:  Take Metoprolol  50 mg two hours before your CT  *If you need a refill on your cardiac medications before your next appointment, please call your pharmacy*   Lab Work: Your physician recommends that you have labs done in the office today. Your test included  thyroid panel  If you have labs (blood work) drawn today and your tests are completely normal, you will receive your results only by: MyChart Message (if you have MyChart) OR A paper copy in the mail If you have any lab test that is abnormal or we need to change your treatment, we will call you to review the results.   Testing/Procedures: Echocardiogram An echocardiogram is a test that uses sound waves (ultrasound) to produce images of the heart. Images from an echocardiogram can provide important information about: Heart size and shape. The size and thickness and movement of your heart's walls. Heart muscle function and strength. Heart valve function or if you have stenosis. Stenosis is when the heart valves are too narrow. If blood is flowing backward through the heart valves (regurgitation). A tumor or infectious growth around the heart valves. Areas of heart muscle that are not working well because of poor blood flow or injury from a heart attack. Aneurysm detection. An aneurysm is a weak or damaged part of an artery wall. The wall bulges out from the normal force of blood pumping through the body. Tell a health care provider about: Any allergies you have. All medicines you are taking, including vitamins, herbs, eye drops, creams, and over-the-counter medicines. Any blood disorders you have. Any surgeries you have had. Any medical conditions you have. Whether you are pregnant or may be pregnant. What are the risks? Generally, this is a safe test. However, problems may occur, including an allergic reaction to dye (contrast) that may be used during the test. What happens before the test? No specific  preparation is needed. You may eat and drink normally. What happens during the test?  You will take off your clothes from the waist up and put on a hospital gown. Electrodes or electrocardiogram (ECG)patches may be placed on your chest. The electrodes or patches are then connected to a device that monitors your heart rate and rhythm. You will lie down on a table for an ultrasound exam. A gel will be applied to your chest to help sound waves pass through your skin. A handheld device, called a transducer, will be pressed against your chest and moved over your heart. The transducer produces sound waves that travel to your heart and bounce back (or "echo" back) to the transducer. These sound waves will be captured in real-time and changed into images of your heart that can be viewed on a video monitor. The images will be recorded on a computer and reviewed by your health care provider. You may be asked to change positions or hold your breath for a short time. This makes it easier to get different views or better views of your heart. In some cases, you may receive contrast through an IV in one of your veins. This can improve the quality of the pictures from your heart. The procedure may vary among health care providers and hospitals. What can I expect after the test? You may return to your normal, everyday life, including diet, activities, and medicines, unless your health care provider tells you not to do that. Follow these instructions at home: It is up to you to get the results of your test. Ask your  health care provider, or the department that is doing the test, when your results will be ready. Keep all follow-up visits. This is important. Summary An echocardiogram is a test that uses sound waves (ultrasound) to produce images of the heart. Images from an echocardiogram can provide important information about the size and shape of your heart, heart muscle function, heart valve function, and other  possible heart problems. You do not need to do anything to prepare before this test. You may eat and drink normally. After the echocardiogram is completed, you may return to your normal, everyday life, unless your health care provider tells you not to do that. This information is not intended to replace advice given to you by your health care provider. Make sure you discuss any questions you have with your health care provider. Document Revised: 03/09/2021 Document Reviewed: 02/17/2020 Elsevier Patient Education  2023 Elsevier Inc[employer]      Hold all erectile dysfunction medications at least 3 days (72 hrs) prior to test.  On the Night Before the Test: Be sure to Drink plenty of water . Do not consume any caffeinated/decaffeinated beverages or chocolate 12 hours prior to your test. Do not take any antihistamines 12 hours prior to your test. If the patient has contrast allergy:   On the Day of the Test: Drink plenty of water  until 1 hour prior to the test. Do not eat any food 4 hours prior to the test. You may take your regular medications prior to the test.  Take metoprolol  (Lopressor ) two hours prior to test. FEMALES- please wear underwire-free bra if available, avoid dresses & tight clothing        After the Test: Drink plenty of water . After receiving IV contrast, you may experience a mild flushed feeling. This is normal. On occasion, you may experience a mild rash up to 24 hours after the test. This is not dangerous. If this occurs, you can take Benadryl  25 mg and increase your fluid intake. If you experience trouble breathing, this can be serious. If it is severe call 911 IMMEDIATELY. If it is mild, please call our office. If you take any of these medications: Glipizide/Metformin, Avandament, Glucavance, please do not take 48 hours after completing test unless otherwise instructed.  We will call to schedule your test 2-4 weeks out understanding that some insurance companies will  need an authorization prior to the service being performed.   For non-scheduling related questions, please contact the cardiac imaging nurse navigator should you have any questions/concerns: Jinger Mount, Cardiac Imaging Nurse Navigator Chase Copping, Cardiac Imaging Nurse Navigator [facility] Direct Office Dial: [phone]   For scheduling needs, including cancellations and rescheduling, please call [place], [phone].    Follow-Up: At [facility], you and your health needs are our priority.  As part of our continuing mission to provide you with exceptional heart care, we have created designated Provider Care Teams.  These Care Teams include your primary Cardiologist (physician) and Advanced Practice Providers (APPs -  Physician Assistants and Nurse Practitioners) who all work together to provide you with the care you need, when you need it.  We recommend signing up for the patient portal called "MyChart".  Sign up information is provided on this After Visit Summary.  MyChart is used to connect with patients for Virtual Visits (Telemedicine).  Patients are able to view lab/test results, encounter notes, upcoming appointments, etc.  Non-urgent messages can be sent to your provider as well.   To learn  more about what you can do with MyChart, go to [email or web address].    Your next appointment:   Based on test results   Other Instructions Cardiac CT Angiogram A cardiac CT angiogram is a procedure to look at the heart and the area around the heart. It may be done to help find the cause of chest pains or other symptoms of heart disease. During this procedure, a substance called contrast dye is injected into the blood vessels in the area to be checked. A large X-ray machine, called a CT scanner, then takes detailed pictures of the heart and the surrounding area. The procedure is also sometimes called a coronary CT angiogram, coronary artery scanning, or  CTA. A cardiac CT angiogram allows the health care provider to see how well blood is flowing to and from the heart. The health care provider will be able to see if there are any problems, such as: Blockage or narrowing of the coronary arteries in the heart. Fluid around the heart. Signs of weakness or disease in the muscles, valves, and tissues of the heart. Tell a health care provider about: Any allergies you have. This is especially important if you have had a previous allergic reaction to contrast dye. All medicines you are taking, including vitamins, herbs, eye drops, creams, and over-the-counter medicines. Any blood disorders you have. Any surgeries you have had. Any medical conditions you have. Whether you are pregnant or may be pregnant. Any anxiety disorders, chronic pain, or other conditions you have that may increase your stress or prevent you from lying still. What are the risks? Generally, this is a safe procedure. However, problems may occur, including: Bleeding. Infection. Allergic reactions to medicines or dyes. Damage to other structures or organs. Kidney damage from the contrast dye that is used. Increased risk of cancer from radiation exposure. This risk is low. Talk with your health care provider about: The risks and benefits of testing. How you can receive the lowest dose of radiation. What happens before the procedure? Wear comfortable clothing and remove any jewelry, glasses, dentures, and hearing aids. Follow instructions from your health care provider about eating and drinking. This may include: For 12 hours before the procedure -- avoid caffeine. This includes tea, coffee, soda, energy drinks, and diet pills. Drink plenty of water  or other fluids that do not have caffeine in them. Being well hydrated can prevent complications. For 4-6 hours before the procedure -- stop eating and drinking. The contrast dye can cause nausea, but this is less likely if your stomach  is empty. Ask your health care provider about changing or stopping your regular medicines. This is especially important if you are taking diabetes medicines, blood thinners, or medicines to treat problems with erections (erectile dysfunction). What happens during the procedure?  Hair on your chest may need to be removed so that small sticky patches called electrodes can be placed on your chest. These will transmit information that helps to monitor your heart during the procedure. An IV will be inserted into one of your veins. You might be given a medicine to control your heart rate during the procedure. This will help to ensure that good images are obtained. You will be asked to lie on an exam table. This table will slide in and out of the CT machine during the procedure. Contrast dye will be injected into the IV. You might feel warm, or you may get a metallic taste in your mouth. You will be given a medicine called nitroglycerin .  This will relax or dilate the arteries in your heart. The table that you are lying on will move into the CT machine tunnel for the scan. The person running the machine will give you instructions while the scans are being done. You may be asked to: Keep your arms above your head. Hold your breath. Stay very still, even if the table is moving. When the scanning is complete, you will be moved out of the machine. The IV will be removed. The procedure may vary among health care providers and hospitals. What can I expect after the procedure? After your procedure, it is common to have: A metallic taste in your mouth from the contrast dye. A feeling of warmth. A headache from the nitroglycerin. Follow these instructions at home: Take over-the-counter and prescription medicines only as told by your health care provider. If you are told, drink enough fluid to keep your urine pale yellow. This will help to flush the contrast dye out of your body. Most people can return to  their normal activities right after the procedure. Ask your health care provider what activities are safe for you. It is up to you to get the results of your procedure. Ask your health care provider, or the department that is doing the procedure, when your results will be ready. Keep all follow-up visits as told by your health care provider. This is important. Contact a health care provider if: You have any symptoms of allergy to the contrast dye. These include: Shortness of breath. Rash or hives. A racing heartbeat. Summary A cardiac CT angiogram is a procedure to look at the heart and the area around the heart. It may be done to help find the cause of chest pains or other symptoms of heart disease. During this procedure, a large X-ray machine, called a CT scanner, takes detailed pictures of the heart and the surrounding area after a contrast dye has been injected into blood vessels in the area. Ask your health care provider about changing or stopping your regular medicines before the procedure. This is especially important if you are taking diabetes medicines, blood thinners, or medicines to treat erectile dysfunction. If you are told, drink enough fluid to keep your urine pale yellow. This will help to flush the contrast dye out of your body. This information is not intended to replace advice given to you by your health care provider. Make sure you discuss any questions you have with your health care provider. Document Revised: 02/19/2019 Document Reviewed: 02/19/2019 Elsevier Patient Education  2020 [employer].
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
## 2023-10-31 NOTE — Addendum Note (Signed)
 Addended by: Loyce Klasen P on: 10/31/2023 10:11 AM   Modules accepted: Orders

## 2023-11-01 LAB — T3, FREE: T3, Free: 3.5 pg/mL (ref 2.0–4.4)

## 2023-11-01 LAB — T4, FREE: Free T4: 1.16 ng/dL (ref 0.82–1.77)

## 2023-11-01 LAB — TSH: TSH: 5.65 u[IU]/mL — ABNORMAL HIGH (ref 0.450–4.500)

## 2023-11-14 ENCOUNTER — Encounter (HOSPITAL_COMMUNITY): Payer: Self-pay

## 2023-11-14 ENCOUNTER — Ambulatory Visit: Payer: Self-pay

## 2023-11-14 DIAGNOSIS — R0789 Other chest pain: Secondary | ICD-10-CM

## 2023-11-15 ENCOUNTER — Telehealth (HOSPITAL_COMMUNITY): Payer: Self-pay | Admitting: Emergency Medicine

## 2023-11-15 LAB — ECHOCARDIOGRAM COMPLETE
AR max vel: 3.54 cm2
AV Area VTI: 3.22 cm2
AV Area mean vel: 3.09 cm2
AV Mean grad: 3 mmHg
AV Peak grad: 5.3 mmHg
Ao pk vel: 1.15 m/s
Area-P 1/2: 3.87 cm2
Calc EF: 67.8 %
MV VTI: 3.44 cm2
S' Lateral: 2.2 cm
Single Plane A2C EF: 72.4 %
Single Plane A4C EF: 63.4 %

## 2023-11-15 NOTE — Telephone Encounter (Signed)
 Attempted to call patient regarding upcoming cardiac CT appointment. Left message on voicemail with name and callback number Rockwell Alexandria RN Navigator Cardiac Imaging [facility] [phone] Office [phone] Cell

## 2023-11-16 ENCOUNTER — Ambulatory Visit (HOSPITAL_COMMUNITY)
Admission: RE | Admit: 2023-11-16 | Discharge: 2023-11-16 | Disposition: A | Discharge status: Home / Self Care | Payer: Self-pay | Source: Ambulatory Visit | Attending: Cardiology

## 2023-11-16 ENCOUNTER — Telehealth: Payer: Self-pay | Admitting: Emergency Medicine

## 2023-11-16 ENCOUNTER — Other Ambulatory Visit: Payer: Self-pay | Admitting: Cardiology

## 2023-11-16 ENCOUNTER — Ambulatory Visit (HOSPITAL_COMMUNITY)
Admission: RE | Admit: 2023-11-16 | Discharge: 2023-11-16 | Disposition: A | Discharge status: Home / Self Care | Payer: Self-pay | Source: Ambulatory Visit

## 2023-11-16 DIAGNOSIS — R931 Abnormal findings on diagnostic imaging of heart and coronary circulation: Secondary | ICD-10-CM

## 2023-11-16 DIAGNOSIS — I2510 Atherosclerotic heart disease of native coronary artery without angina pectoris: Secondary | ICD-10-CM

## 2023-11-16 DIAGNOSIS — R0789 Other chest pain: Secondary | ICD-10-CM | POA: Insufficient documentation

## 2023-11-16 MED ORDER — METOPROLOL TARTRATE 5 MG/5ML IV SOLN: 10.0000 mg | Freq: Once | INTRAVENOUS | Status: DC | PRN

## 2023-11-16 MED ORDER — IOHEXOL 350 MG/ML SOLN
100.0000 mL | Freq: Once | INTRAVENOUS | Status: AC | PRN
  Administered 2023-11-16: 100 mL via INTRAVENOUS

## 2023-11-16 MED ORDER — SODIUM CHLORIDE 0.9 % IV BOLUS
500.0000 mL | Freq: Once | INTRAVENOUS | Status: AC
  Administered 2023-11-16: 500 mL via INTRAVENOUS

## 2023-11-16 MED ORDER — NITROGLYCERIN 0.4 MG SL SUBL
0.8000 mg | SUBLINGUAL_TABLET | Freq: Once | SUBLINGUAL | Status: AC
  Administered 2023-11-16: 0.4 mg via SUBLINGUAL

## 2023-11-16 MED ORDER — NITROGLYCERIN 0.4 MG SL SUBL
SUBLINGUAL_TABLET | SUBLINGUAL | Status: AC
  Filled 2023-11-16: qty 2

## 2023-11-16 MED ORDER — DILTIAZEM HCL 25 MG/5ML IV SOLN: 10.0000 mg | INTRAVENOUS | Status: DC | PRN

## 2023-11-16 NOTE — Telephone Encounter (Signed)
-----   Message from Daymon Evans Madireddy sent at 11/16/2023  2:12 PM EDT ----- Results from cardiac CT imaging full report currently pending. Noted to have ostial left main stenosis up to 50%, CT FFR not significant although could be falsely negative in the setting of left main disease.  Based on her symptoms I would consider further testing with cardiac catheterization is warranted. Please schedule her for a follow-up visit with me in the office next available.

## 2023-11-16 NOTE — Telephone Encounter (Signed)
 Patient returned call.  Reviewed Dr. Madireddy's note. Appointment made for 5/14. Patient verbalized understanding and had no further questions.

## 2023-11-16 NOTE — Telephone Encounter (Signed)
 Left voice mail

## 2023-11-18 DIAGNOSIS — I2510 Atherosclerotic heart disease of native coronary artery without angina pectoris: Secondary | ICD-10-CM | POA: Insufficient documentation

## 2023-11-18 HISTORY — DX: Atherosclerotic heart disease of native coronary artery without angina pectoris: I25.10

## 2023-11-18 NOTE — Progress Notes (Signed)
 Sent a message to her about elevated TSH levels with normal T3 and T4. No med changes at this time. Will require follow-up with PCP for thyroid function monitoring.  Please forward results to PCP within note.  Thank you

## 2023-11-20 ENCOUNTER — Ambulatory Visit: Payer: Self-pay | Admitting: Emergency Medicine

## 2023-11-20 ENCOUNTER — Other Ambulatory Visit: Payer: Self-pay

## 2023-11-21 ENCOUNTER — Ambulatory Visit: Payer: Self-pay

## 2023-11-21 ENCOUNTER — Encounter: Payer: Self-pay | Admitting: Emergency Medicine

## 2023-11-21 VITALS — BP 103/71 | HR 73 | Ht 61.6 in | Wt 167.0 lb

## 2023-11-21 DIAGNOSIS — R931 Abnormal findings on diagnostic imaging of heart and coronary circulation: Secondary | ICD-10-CM

## 2023-11-21 DIAGNOSIS — I25119 Atherosclerotic heart disease of native coronary artery with unspecified angina pectoris: Secondary | ICD-10-CM | POA: Diagnosis not present
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
## 2023-11-21 NOTE — Assessment & Plan Note (Signed)
 Atypical chest pain Cardiac CT 11-16-2023: Calcium score 0, total plaque volume 31 mm cube, CAD RADS 4 study based on ostial left main 50% stenosis.  CT FFR unreliable and left main disease.  Recommended cardiac catheterization  In the setting of findings from cardiac CTA discuss further evaluation with cardiac catheterization.  Discussed the procedure at length and discussed associated risks with the procedure. Shared Decision Making/Informed Consent{ The risks [stroke (1 in 1000), death (1 in 1000), kidney failure [usually temporary] (1 in 500), bleeding (1 in 200), allergic reaction [possibly serious] (1 in 200)], benefits (diagnostic support and management of coronary artery disease) and alternatives of a cardiac catheterization were discussed in detail with her and her husband and they are willing to proceed.  Tentatively to schedule at [facility]. Continue with aspirin 81 mg once daily. Avoid moderate to heavy exertion. Limit to light duties. Avoid lifting more than 20 pounds. Will provide work excuse letter.  Follow-up in the office with me tentatively in 2 weeks.  If her cardiac cath is unremarkable, given her other nonspecific symptoms recommended she continue with adequate salt and fluid intake, avoid sudden change in position. Advised to proceed with using compression socks for now.  If no significant coronary artery disease will further evaluate for any rhythm abnormalities and for dysautonomia.
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
## 2023-11-21 NOTE — Patient Instructions (Signed)
 Medication Instructions:  Your physician has recommended you make the following change in your medication:   Take 81 mg coated aspirin daily. Use nitroglycerin as needed for chest pain.  *If you need a refill on your cardiac medications before your next appointment, please call your pharmacy*   Lab Work: Your physician recommends that you have a BMET and CBC today in the office for your upcoming procedure.  If you have labs (blood work) drawn today and your tests are completely normal, you will receive your results only by: MyChart Message (if you have MyChart) OR A paper copy in the mail If you have any lab test that is abnormal or we need to change your treatment, we will call you to review the results.   Testing/Procedures:  [place] [facility] DEPT OF [facility] [address] [place] [address] Dept: [phone] Loc: [phone]  [name] Hampton  11/21/2023  You are scheduled for a Cardiac Catheterization on Monday, May 19 with Dr. Veryl Gottron End.  1. Please arrive at the [facility] (Main Entrance A) at [facility]: [address] [place], [place] [address] at 5:30 AM (This time is 2 hour(s) before your procedure to ensure your preparation).   Free valet parking service is available. You will check in at ADMITTING. The support person will be asked to wait in the waiting room.  It is OK to have someone drop you off and come back when you are ready to be discharged.    Special note: Every effort is made to have your procedure done on time. Please understand that emergencies sometimes delay scheduled procedures.  2. Diet: Do not eat solid foods after midnight.  The patient may have clear liquids until 5am upon the day of the procedure.  3. Labs:   4. Medication instructions in preparation for your procedure:   Contrast Allergy: No   On the morning of your procedure, take your Aspirin 81 mg and  any morning medicines NOT listed above.  You may use sips of water .  5. Plan to go home the same day, you will only stay overnight if medically necessary. 6. Bring a current list of your medications and current insurance cards. 7. You MUST have a responsible person to drive you home. 8. Someone MUST be with you the first 24 hours after you arrive home or your discharge will be delayed. 9. Please wear clothes that are easy to get on and off and wear slip-on shoes.  Thank you for allowing us  to care for you!   -- [facility]   Follow-Up: At [facility], you and your health needs are our priority.  As part of our continuing mission to provide you with exceptional heart care, we have created designated Provider Care Teams.  These Care Teams include your primary Cardiologist (physician) and Advanced Practice Providers (APPs -  Physician Assistants and Nurse Practitioners) who all work together to provide you with the care you need, when you need it.  We recommend signing up for the patient portal called "MyChart".  Sign up information is provided on this After Visit Summary.  MyChart is used to connect with patients for Virtual Visits (Telemedicine).  Patients are able to view lab/test results, encounter notes, upcoming appointments, etc.  Non-urgent messages can be sent to your provider as well.   To learn more about what you can do with MyChart, go to [email or web address].  Your next appointment:   2 week follow up    Other Instructions  Coronary Angiogram With Stent Coronary angiogram with stent placement is a procedure to widen or open a narrow blood vessel of the heart (coronary artery). Arteries may become blocked by cholesterol buildup (plaques) in the lining of the artery wall. When a coronary artery becomes partially blocked, blood flow to that area decreases. This may lead to chest pain or a heart attack (myocardial infarction). A stent is a  small piece of metal that looks like mesh or spring. Stent placement may be done as treatment after a heart attack, or to prevent a heart attack if a blocked artery is found by a coronary angiogram. Let your health care provider know about: Any allergies you have, including allergies to medicines or contrast dye. All medicines you are taking, including vitamins, herbs, eye drops, creams, and over-the-counter medicines. Any problems you or family members have had with anesthetic medicines. Any blood disorders you have. Any surgeries you have had. Any medical conditions you have, including kidney problems or kidney failure. Whether you are pregnant or may be pregnant. Whether you are breastfeeding. What are the risks? Generally, this is a safe procedure. However, serious problems may occur, including: Damage to nearby structures or organs, such as the heart, blood vessels, or kidneys. A return of blockage. Bleeding, infection, or bruising at the insertion site. A collection of blood under the skin (hematoma) at the insertion site. A blood clot in another part of the body. Allergic reaction to medicines or dyes. Bleeding into the abdomen (retroperitoneal bleeding). Stroke (rare). Heart attack (rare). What happens before the procedure? Staying hydrated Follow instructions from your health care provider about hydration, which may include: Up to 2 hours before the procedure - you may continue to drink clear liquids, such as water , clear fruit juice, black coffee, and plain tea.    Eating and drinking restrictions Follow instructions from your health care provider about eating and drinking, which may include: 8 hours before the procedure - stop eating heavy meals or foods, such as meat, fried foods, or fatty foods. 6 hours before the procedure - stop eating light meals or foods, such as toast or cereal. 2 hours before the procedure - stop drinking clear liquids. Medicines Ask your health  care provider about: Changing or stopping your regular medicines. This is especially important if you are taking diabetes medicines or blood thinners. Taking medicines such as aspirin and ibuprofen . These medicines can thin your blood. Do not take these medicines unless your health care provider tells you to take them. Generally, aspirin is recommended before a thin tube, called a catheter, is passed through a blood vessel and inserted into the heart (cardiac catheterization). Taking over-the-counter medicines, vitamins, herbs, and supplements. General instructions Do not use any products that contain nicotine or tobacco for at least 4 weeks before the procedure. These products include cigarettes, e-cigarettes, and chewing tobacco. If you need help quitting, ask your health care provider. Plan to have someone take you home from the hospital or clinic. If you will be going home right after the procedure, plan to have someone with you for 24 hours. You may have tests and imaging procedures. Ask your health care provider: How your insertion site will be marked. Ask which artery will be used for the procedure. What steps will be taken to help prevent infection. These may include: Removing hair at the insertion site. Washing skin with a germ-killing soap. Taking antibiotic  medicine. What happens during the procedure? An IV will be inserted into one of your veins. Electrodes may be placed on your chest to monitor your heart rate during the procedure. You will be given one or more of the following: A medicine to help you relax (sedative). A medicine to numb the area (local anesthetic) for catheter insertion. A small incision will be made for catheter insertion. The catheter will be inserted into an artery using a guide wire. The location may be in your groin, your wrist, or the fold of your arm (near your elbow). An X-ray procedure (fluoroscopy) will be used to help guide the catheter to the opening  of the heart arteries. A dye will be injected into the catheter. X-rays will be taken. The dye helps to show where any narrowing or blockages are located in the arteries. Tell your health care provider if you have chest pain or trouble breathing. A tiny wire will be guided to the blocked spot, and a balloon will be inflated to make the artery wider. The stent will be expanded to crush the plaques into the wall of the vessel. The stent will hold the area open and improve the blood flow. Most stents have a drug coating to reduce the risk of the stent narrowing over time. The artery may be made wider using a drill, laser, or other tools that remove plaques. The catheter will be removed when the blood flow improves. The stent will stay where it was placed, and the lining of the artery will grow over it. A bandage (dressing) will be placed on the insertion site. Pressure will be applied to stop bleeding. The IV will be removed. This procedure may vary among health care providers and hospitals.    What happens after the procedure? Your blood pressure, heart rate, breathing rate, and blood oxygen level will be monitored until you leave the hospital or clinic. If the procedure is done through the leg, you will lie flat in bed for a few hours or for as long as told by your health care provider. You will be instructed not to bend or cross your legs. The insertion site and the pulse in your foot or wrist will be checked often. You may have more blood tests, X-rays, and a test that records the electrical activity of your heart (electrocardiogram, or ECG). Do not drive for 24 hours if you were given a sedative during your procedure. Summary Coronary angiogram with stent placement is a procedure to widen or open a narrowed coronary artery. This is done to treat heart problems. Before the procedure, let your health care provider know about all the medical conditions and surgeries you have or have had. This is a  safe procedure. However, some problems may occur, including damage to nearby structures or organs, bleeding, blood clots, or allergies. Follow your health care provider's instructions about eating, drinking, medicines, and other lifestyle changes, such as quitting tobacco use before the procedure. This information is not intended to replace advice given to you by your health care provider. Make sure you discuss any questions you have with your health care provider. Document Revised: 01/15/2019 Document Reviewed: 01/15/2019 Elsevier Patient Education  2021 Elsevier Inc.  Aspirin and Your Heart Aspirin is a medicine that prevents the platelets in your blood from sticking together. Platelets are the cells that your blood uses for clotting. Aspirin can be used to help reduce the risk of blood clots, heart attacks, and other heart-related problems. What are the  risks? Daily use of aspirin can cause side effects. Some of these include: Bleeding. Bleeding can be minor or serious. An example of minor bleeding is bleeding from a cut, and the bleeding does not stop. An example of more serious bleeding is stomach bleeding or, rarely, bleeding into the brain. Your risk of bleeding increases if you are also taking NSAIDs, such as ibuprofen . Increased bruising. Upset stomach. An allergic reaction. People who have growths inside the nose (nasal polyps) have an increased risk of developing an aspirin allergy. How to use aspirin to care for your heart Take aspirin only as told by your health care provider. Make sure that you understand how much to take and what form to take. The two forms of aspirin are: Non-enteric-coated.This type of aspirin does not have a coating and is absorbed quickly. This type of aspirin also comes in a chewable form. Enteric-coated. This type of aspirin has a coating that releases the medicine very slowly. Enteric-coated aspirin might cause less stomach upset than non-enteric-coated  aspirin. This type of aspirin should not be chewed or crushed. Work with your health care provider to find out whether it is safe and beneficial for you to take aspirin daily. Taking aspirin daily may be helpful if: You have had a heart attack or chest pain, or you are at risk for a heart attack. You have a condition in which certain heart vessels are blocked (coronary artery disease), and you have had a procedure to treat it. Examples are: Open-heart surgery, such as coronary artery bypass surgery (CABG). Coronary angioplasty,which is done to widen a blood vessel of your heart. Having a small mesh tube, or stent, placed in your coronary artery. You have had certain types of stroke or a mini-stroke known as a transient ischemic attack (TIA). You have a narrowing of the arteries that supply the limbs (peripheral artery disease, or PAD). You have long-term (chronic) heart rhythm problems, such as atrial fibrillation, and your health care provider thinks aspirin may help. You have valve disease or have had surgery on a valve. You are considered at increased risk of developing coronary artery disease or PAD.    Follow these instructions at home Medicines Take over-the-counter and prescription medicines only as told by your health care provider. If you are taking blood thinners: Talk with your health care provider before you take any medicines that contain aspirin or NSAIDs, such as ibuprofen . These medicines increase your risk for dangerous bleeding. Take your medicine exactly as told, at the same time every day. Avoid activities that could cause injury or bruising, and follow instructions about how to prevent falls. Wear a medical alert bracelet or carry a card that lists what medicines you take. General instructions Do not drink alcohol if: Your health care provider tells you not to drink. You are pregnant, may be pregnant, or are planning to become pregnant. If you drink alcohol: Limit how  much you use to: 0-1 drink a day for women. 0-2 drinks a day for men. Be aware of how much alcohol is in your drink. In the [place] one drink equals one 12 oz bottle of beer (355 mL), one 5 oz glass of wine (148 mL), or one 1 oz glass of hard liquor (44 mL). Keep all follow-up visits as told by your health care provider. This is important. Where to find more information The American Heart Association: www.heart.org Contact a health care provider if you have: Unusual bleeding or bruising. Stomach pain or nausea. Ringing  in your ears. An allergic reaction that causes hives, itchy skin, or swelling of the lips, tongue, or face. Get help right away if: You notice that your bowel movements are bloody, or dark red or black in color. You vomit or cough up blood. You have blood in your urine. You cough, breathe loudly (wheeze), or feel short of breath. You have chest pain, especially if the pain spreads to your arms, back, neck, or jaw. You have a headache with confusion. You have any symptoms of a stroke. "BE FAST" is an easy way to remember the main warning signs of a stroke: B - Balance. Signs are dizziness, sudden trouble walking, or loss of balance. E - Eyes. Signs are trouble seeing or a sudden change in vision. F - Face. Signs are sudden weakness or numbness of the face, or the face or eyelid drooping on one side. A - Arms. Signs are weakness or numbness in an arm. This happens suddenly and usually on one side of the body. S - Speech. Signs are sudden trouble speaking, slurred speech, or trouble understanding what people say. T - Time. Time to call emergency services. Write down what time symptoms started. You have other signs of a stroke, such as: A sudden, severe headache with no known cause. Nausea or vomiting. Seizure. These symptoms may represent a serious problem that is an emergency. Do not wait to see if the symptoms will go away. Get medical help right away. Call your local  emergency services (911 in the [place] Do not drive yourself to the hospital. Summary Aspirin use can help reduce the risk of blood clots, heart attacks, and other heart-related problems. Daily use of aspirin can cause side effects. Take aspirin only as told by your health care provider. Make sure that you understand how much to take and what form to take. Your health care provider will help you determine whether it is safe and beneficial for you to take aspirin daily. This information is not intended to replace advice given to you by your health care provider. Make sure you discuss any questions you have with your health care provider. Document Revised: 03/31/2019 Document Reviewed: 03/31/2019 Elsevier Patient Education  2021 Elsevier Inc. Nitroglycerin sublingual tablets What is this medicine? NITROGLYCERIN (nye troe GLI ser in) is a type of vasodilator. It relaxes blood vessels, increasing the blood and oxygen supply to your heart. This medicine is used to relieve chest pain caused by angina. It is also used to prevent chest pain before activities like climbing stairs, going outdoors in cold weather, or sexual activity. This medicine may be used for other purposes; ask your health care provider or pharmacist if you have questions. COMMON BRAND NAME(S): Nitroquick, Nitrostat, Nitrotab What should I tell my health care provider before I take this medicine? They need to know if you have any of these conditions: anemia head injury, recent stroke, or bleeding in the brain liver disease previous heart attack an unusual or allergic reaction to nitroglycerin, other medicines, foods, dyes, or preservatives pregnant or trying to get pregnant breast-feeding How should I use this medicine? Take this medicine by mouth as needed. Use at the first sign of an angina attack (chest pain or tightness). You can also take this medicine 5 to 10 minutes before an event likely to produce chest pain. Follow the  directions exactly as written on the prescription label. Place one tablet under your tongue and let it dissolve. Do not swallow whole. Replace the dose if  you accidentally swallow it. It will help if your mouth is not dry. Saliva around the tablet will help it to dissolve more quickly. Do not eat or drink, smoke or chew tobacco while a tablet is dissolving. Sit down when taking this medicine. In an angina attack, you should feel better within 5 minutes after your first dose. You can take a dose every 5 minutes up to a total of 3 doses. If you do not feel better or feel worse after 1 dose, call 9-1-1 at once. Do not take more than 3 doses in 15 minutes. Your health care provider might give you other directions. Follow those directions if he or she does. Do not take your medicine more often than directed. Talk to your health care provider about the use of this medicine in children. Special care may be needed. Overdosage: If you think you have taken too much of this medicine contact a poison control center or emergency room at once. NOTE: This medicine is only for you. Do not share this medicine with others. What if I miss a dose? This does not apply. This medicine is only used as needed. What may interact with this medicine? Do not take this medicine with any of the following medications: certain migraine medicines like ergotamine and dihydroergotamine (DHE) medicines used to treat erectile dysfunction like sildenafil, tadalafil, and vardenafil riociguat This medicine may also interact with the following medications: alteplase aspirin heparin medicines for high blood pressure medicines for mental depression other medicines used to treat angina phenothiazines like chlorpromazine, mesoridazine, prochlorperazine, thioridazine This list may not describe all possible interactions. Give your health care provider a list of all the medicines, herbs, non-prescription drugs, or dietary supplements you use.  Also tell them if you smoke, drink alcohol, or use illegal drugs. Some items may interact with your medicine. What should I watch for while using this medicine? Tell your doctor or health care professional if you feel your medicine is no longer working. Keep this medicine with you at all times. Sit or lie down when you take your medicine to prevent falling if you feel dizzy or faint after using it. Try to remain calm. This will help you to feel better faster. If you feel dizzy, take several deep breaths and lie down with your feet propped up, or bend forward with your head resting between your knees. You may get drowsy or dizzy. Do not drive, use machinery, or do anything that needs mental alertness until you know how this drug affects you. Do not stand or sit up quickly, especially if you are an older patient. This reduces the risk of dizzy or fainting spells. Alcohol can make you more drowsy and dizzy. Avoid alcoholic drinks. Do not treat yourself for coughs, colds, or pain while you are taking this medicine without asking your doctor or health care professional for advice. Some ingredients may increase your blood pressure. What side effects may I notice from receiving this medicine? Side effects that you should report to your doctor or health care professional as soon as possible: allergic reactions (skin rash, itching or hives; swelling of the face, lips, or tongue) low blood pressure (dizziness; feeling faint or lightheaded, falls; unusually weak or tired) low red blood cell counts (trouble breathing; feeling faint; lightheaded, falls; unusually weak or tired) Side effects that usually do not require medical attention (report to your doctor or health care professional if they continue or are bothersome): facial flushing (redness) headache nausea, vomiting This list may  not describe all possible side effects. Call your doctor for medical advice about side effects. You may report side effects to  FDA at 1-800-FDA-1088. Where should I keep my medicine? Keep out of the reach of children. Store at room temperature between 20 and 25 degrees C (68 and 77 degrees F). Store in Retail buyer. Protect from light and moisture. Keep tightly closed. Throw away any unused medicine after the expiration date. NOTE: This sheet is a summary. It may not cover all possible information. If you have questions about this medicine, talk to your doctor, pharmacist, or health care provider.  2021 Elsevier/Gold Standard (2018-03-27 16:46:32)

## 2023-11-22 ENCOUNTER — Telehealth: Payer: Self-pay | Admitting: *Deleted

## 2023-11-22 NOTE — Telephone Encounter (Signed)
 Cardiac Catheterization scheduled at [facility] for: Monday Nov 26, 2023 7:30 AM Arrival time [facility] Main Entrance A at: 5:30 AM  Nothing to eat after midnight prior to procedure, clear liquids until 5 AM day of procedure.  Medication instructions: -Usual morning medications can be taken with sips of water  including aspirin 81 mg.  Plan to go home the same day, you will only stay overnight if medically necessary.  You must have responsible adult to drive you home.  Someone must be with you the first 24 hours after you arrive home.  Reviewed procedure instructions with patient.

## 2023-11-24 ENCOUNTER — Emergency Department (HOSPITAL_COMMUNITY)
Admission: EM | Admit: 2023-11-24 | Discharge: 2023-11-24 | Disposition: A | Discharge status: Home / Self Care | Payer: Self-pay | Attending: Emergency Medicine | Admitting: Emergency Medicine

## 2023-11-24 ENCOUNTER — Emergency Department (HOSPITAL_COMMUNITY): Payer: Self-pay

## 2023-11-24 ENCOUNTER — Other Ambulatory Visit: Payer: Self-pay

## 2023-11-24 DIAGNOSIS — R002 Palpitations: Secondary | ICD-10-CM | POA: Insufficient documentation

## 2023-11-24 DIAGNOSIS — R079 Chest pain, unspecified: Secondary | ICD-10-CM

## 2023-11-24 DIAGNOSIS — Z7982 Long term (current) use of aspirin: Secondary | ICD-10-CM | POA: Insufficient documentation

## 2023-11-24 DIAGNOSIS — R0789 Other chest pain: Secondary | ICD-10-CM | POA: Insufficient documentation

## 2023-11-24 LAB — BASIC METABOLIC PANEL WITH GFR
Anion gap: 10 (ref 5–15)
BUN: 9 mg/dL (ref 6–20)
CO2: 23 mmol/L (ref 22–32)
Calcium: 9.4 mg/dL (ref 8.9–10.3)
Chloride: 101 mmol/L (ref 98–111)
Creatinine, Ser: 1 mg/dL (ref 0.44–1.00)
GFR, Estimated: >60 mL/min (ref >60)
Glucose, Bld: 108 mg/dL — ABNORMAL HIGH (ref 70–99)
Potassium: 3.6 mmol/L (ref 3.5–5.1)
Sodium: 134 mmol/L — ABNORMAL LOW (ref 135–145)

## 2023-11-24 LAB — CBC
HCT: 43.9 % (ref 36.0–46.0)
Hemoglobin: 15.5 g/dL — ABNORMAL HIGH (ref 12.0–15.0)
MCH: 31.4 pg (ref 26.0–34.0)
MCHC: 35.3 g/dL (ref 30.0–36.0)
MCV: 89 fL (ref 80.0–100.0)
Platelets: 370 10*3/uL (ref 150–400)
RBC: 4.93 MIL/uL (ref 3.87–5.11)
RDW: 11.9 % (ref 11.5–15.5)
WBC: 8.9 10*3/uL (ref 4.0–10.5)
nRBC: 0 % (ref 0.0–0.2)

## 2023-11-24 LAB — TSH: TSH: 8.178 u[IU]/mL — ABNORMAL HIGH (ref 0.350–4.500)

## 2023-11-24 LAB — T4, FREE: Free T4: 0.89 ng/dL (ref 0.61–1.12)

## 2023-11-24 LAB — HCG, SERUM, QUALITATIVE: Preg, Serum: NEGATIVE

## 2023-11-24 LAB — TROPONIN I (HIGH SENSITIVITY)
Troponin I (High Sensitivity): <2 ng/L (ref <18)
Troponin I (High Sensitivity): <2 ng/L (ref <18)

## 2023-11-24 MED ORDER — LORAZEPAM 2 MG/ML IJ SOLN: 2.0000 mg | Freq: Once | INTRAMUSCULAR | Status: DC

## 2023-11-24 MED ORDER — LACTATED RINGERS IV BOLUS
1000.0000 mL | Freq: Once | INTRAVENOUS | Status: AC
  Administered 2023-11-24: 1000 mL via INTRAVENOUS

## 2023-11-24 MED ORDER — ASPIRIN 81 MG PO CHEW
162.0000 mg | CHEWABLE_TABLET | Freq: Once | ORAL | Status: AC
  Administered 2023-11-24: 162 mg via ORAL
  Filled 2023-11-24: qty 2

## 2023-11-24 MED ORDER — LORAZEPAM 2 MG/ML IJ SOLN
1.0000 mg | Freq: Once | INTRAMUSCULAR | Status: AC
  Administered 2023-11-24: 1 mg via INTRAVENOUS
  Filled 2023-11-24: qty 1

## 2023-11-24 MED ORDER — LORAZEPAM 1 MG PO TABS: 1.0000 mg | ORAL_TABLET | Freq: Three times a day (TID) | ORAL | 0 refills | Status: AC | PRN

## 2023-11-24 NOTE — ED Triage Notes (Signed)
 Pt c/o tachycardia at 140 since this morning.  Pt has a cardiologist.

## 2023-11-24 NOTE — ED Notes (Signed)
 Cool wash clothes applied to pt face to decrease hyperventilation and anxiety.

## 2023-11-24 NOTE — Discharge Instructions (Addendum)
 You were seen for your palpitations and chest pain in the emergency department.   At home, please take the Ativan  if you are feeling anxious or palpitations.    Check your MyChart online for the results of any tests that had not resulted by the time you left the emergency department.   Follow-up with your primary doctor in 2-3 days regarding your visit.  Talk to them about your thyroid function.  Follow-up with your cardiologist as scheduled.  Return immediately to the emergency department if you experience any of the following: Worsening palpitations, fainting, chest pain, or any other concerning symptoms.    Thank you for visiting our Emergency Department. It was a pleasure taking care of you today.

## 2023-11-24 NOTE — ED Provider Notes (Signed)
 [place] EMERGENCY DEPARTMENT AT [facility] Provider Note   CSN: [id number] Arrival date & time: 11/24/23  4098     History {Add pertinent medical, surgical, social history, OB history to HPI:1} Chief Complaint  Patient presents with   Tachycardia    [name] Hampton is a 43 y.o. female.  43 year old female who presents to the emergency department with chest discomfort.  Patient reports over the past 5 weeks has had intermittent chest discomfort.  Describes it as a sharp stabbing sensation.  Will last for several minutes at a time.  Also will feel very anxious and have palpitations.  This morning was getting ready for work and she felt a sharp stabbing chest pain and then warmth all over her body and became very tachycardic with a heart rate up into the 150s.  Has had several emergency department visits which have included negative troponins and D-dimers.  Was referred to cardiology and had a coronary CTA on 5/9 which showed possible 50% stenosis of the left ostial main from a noncalcified plaque.  Aorta was normal on that scan.  Supposed to undergo a left heart cath on 5/19.  Did also have a mildly elevated TSH.       Home Medications Prior to Admission medications   Medication Sig Start Date End Date Taking? Authorizing Provider  aspirin EC 81 MG tablet Take 81 mg by mouth daily.    [provider]  loratadine (CLARITIN) 10 MG tablet Take 10 mg by mouth daily.    [provider]  Multiple Vitamin (MULTIVITAMIN) tablet Take 1 tablet by mouth daily. Vitafusion    [provider]  omeprazole  (PRILOSEC) 20 MG capsule Take 20 mg by mouth daily as needed (heartburn or reflex).    [provider]  OVER THE COUNTER MEDICATION Place 1 drop into both eyes daily as needed (dry eyes). Equate eye allergy relief drops    [provider]      Allergies    Peppermint oil, Erythromycin, and Shellfish allergy    Review of Systems   Review of  Systems  Physical Exam Updated Vital Signs Ht 5' 1.6" (1.565 m)   Wt 75.8 kg   BMI 30.94 kg/m  Physical Exam Vitals and nursing note reviewed.  Constitutional:      General: She is not in acute distress.    Appearance: She is well-developed.  HENT:     Head: Normocephalic and atraumatic.     Right Ear: External ear normal.     Left Ear: External ear normal.     Nose: Nose normal.  Eyes:     Extraocular Movements: Extraocular movements intact.     Conjunctiva/sclera: Conjunctivae normal.     Pupils: Pupils are equal, round, and reactive to light.  Cardiovascular:     Rate and Rhythm: Regular rhythm. Tachycardia present.     Heart sounds: No murmur heard. Pulmonary:     Effort: Pulmonary effort is normal. No respiratory distress.     Breath sounds: Normal breath sounds.  Musculoskeletal:     Cervical back: Normal range of motion and neck supple.     Right lower leg: No edema.     Left lower leg: No edema.  Skin:    General: Skin is warm and dry.  Neurological:     Mental Status: She is alert and oriented to person, place, and time. Mental status is at baseline.  Psychiatric:        Mood and Affect:  Mood normal.     ED Results / Procedures / Treatments   Labs (all labs ordered are listed, but only abnormal results are displayed) Labs Reviewed - No data to display  EKG None  Radiology No results found.  Procedures Procedures  {Document cardiac monitor, telemetry assessment procedure when appropriate:1}  Medications Ordered in ED Medications - No data to display  ED Course/ Medical Decision Making/ A&P   {   Click here for ABCD2, HEART and other calculatorsREFRESH Note before signing :1}                              Medical Decision Making Amount and/or Complexity of Data Reviewed Labs: ordered. Radiology: ordered.  Risk OTC drugs. Prescription drug management.   ***  {Document critical care time when appropriate:1} {Document review of labs and  clinical decision tools ie heart score, Chads2Vasc2 etc:1}  {Document your independent review of radiology images, and any outside records:1} {Document your discussion with family members, caretakers, and with consultants:1} {Document social determinants of health affecting pt's care:1} {Document your decision making why or why not admission, treatments were needed:1} Final Clinical Impression(s) / ED Diagnoses Final diagnoses:  None    Rx / DC Orders ED Discharge Orders     None

## 2023-11-26 ENCOUNTER — Encounter (HOSPITAL_COMMUNITY): Payer: Self-pay | Admitting: Internal Medicine

## 2023-11-26 ENCOUNTER — Other Ambulatory Visit: Payer: Self-pay

## 2023-11-26 ENCOUNTER — Ambulatory Visit (HOSPITAL_COMMUNITY)
Admission: RE | Admit: 2023-11-26 | Discharge: 2023-11-26 | Disposition: A | Discharge status: Home / Self Care | Payer: MEDICAID | Attending: Internal Medicine | Admitting: Internal Medicine

## 2023-11-26 ENCOUNTER — Encounter (HOSPITAL_COMMUNITY)
Admission: RE | Disposition: A | Discharge status: Home / Self Care | Payer: Self-pay | Source: Home / Self Care | Attending: Internal Medicine

## 2023-11-26 DIAGNOSIS — R931 Abnormal findings on diagnostic imaging of heart and coronary circulation: Secondary | ICD-10-CM | POA: Insufficient documentation

## 2023-11-26 DIAGNOSIS — I2510 Atherosclerotic heart disease of native coronary artery without angina pectoris: Secondary | ICD-10-CM | POA: Insufficient documentation

## 2023-11-26 DIAGNOSIS — I25119 Atherosclerotic heart disease of native coronary artery with unspecified angina pectoris: Secondary | ICD-10-CM

## 2023-11-26 DIAGNOSIS — Z7982 Long term (current) use of aspirin: Secondary | ICD-10-CM | POA: Insufficient documentation

## 2023-11-26 DIAGNOSIS — R0789 Other chest pain: Secondary | ICD-10-CM

## 2023-11-26 DIAGNOSIS — F17290 Nicotine dependence, other tobacco product, uncomplicated: Secondary | ICD-10-CM | POA: Insufficient documentation

## 2023-11-26 HISTORY — PX: LEFT HEART CATH AND CORONARY ANGIOGRAPHY: CATH118249

## 2023-11-26 SURGERY — LEFT HEART CATH AND CORONARY ANGIOGRAPHY
Anesthesia: LOCAL

## 2023-11-26 MED ORDER — VERAPAMIL HCL 2.5 MG/ML IV SOLN
INTRAVENOUS | Status: DC | PRN
  Administered 2023-11-26: 5 mL via INTRA_ARTERIAL
  Administered 2023-11-26: 10 mL via INTRA_ARTERIAL

## 2023-11-26 MED ORDER — LIDOCAINE HCL (PF) 1 % IJ SOLN
INTRAMUSCULAR | Status: AC
  Filled 2023-11-26: qty 30

## 2023-11-26 MED ORDER — VERAPAMIL HCL 2.5 MG/ML IV SOLN
INTRAVENOUS | Status: AC
  Filled 2023-11-26: qty 2

## 2023-11-26 MED ORDER — HEPARIN SODIUM (PORCINE) 1000 UNIT/ML IJ SOLN
INTRAMUSCULAR | Status: AC
  Filled 2023-11-26: qty 10

## 2023-11-26 MED ORDER — FENTANYL CITRATE (PF) 100 MCG/2ML IJ SOLN
INTRAMUSCULAR | Status: DC | PRN
  Administered 2023-11-26: 25 ug via INTRAVENOUS

## 2023-11-26 MED ORDER — SODIUM CHLORIDE 0.9 % WEIGHT BASED INFUSION: 1.0000 mL/kg/h | INTRAVENOUS | Status: DC

## 2023-11-26 MED ORDER — LIDOCAINE HCL (PF) 1 % IJ SOLN
INTRAMUSCULAR | Status: DC | PRN
  Administered 2023-11-26: 2 mL via INTRADERMAL

## 2023-11-26 MED ORDER — HEPARIN SODIUM (PORCINE) 1000 UNIT/ML IJ SOLN
INTRAMUSCULAR | Status: DC | PRN
  Administered 2023-11-26: 4000 [IU] via INTRAVENOUS

## 2023-11-26 MED ORDER — SODIUM CHLORIDE 0.9 % WEIGHT BASED INFUSION: 3.0000 mL/kg/h | INTRAVENOUS | Status: AC

## 2023-11-26 MED ORDER — METOPROLOL SUCCINATE ER 25 MG PO TB24
12.5000 mg | ORAL_TABLET | Freq: Every day | ORAL | 5 refills | Status: AC
Start: 2023-11-26 — End: 2024-11-25

## 2023-11-26 MED ORDER — HEPARIN (PORCINE) IN NACL 1000-0.9 UT/500ML-% IV SOLN
INTRAVENOUS | Status: DC | PRN
  Administered 2023-11-26 (×2): 500 mL

## 2023-11-26 MED ORDER — MIDAZOLAM HCL 2 MG/2ML IJ SOLN
INTRAMUSCULAR | Status: DC | PRN
  Administered 2023-11-26: 1 mg via INTRAVENOUS

## 2023-11-26 MED ORDER — MIDAZOLAM HCL 2 MG/2ML IJ SOLN
INTRAMUSCULAR | Status: AC
  Filled 2023-11-26: qty 2

## 2023-11-26 MED ORDER — ASPIRIN 81 MG PO CHEW
81.0000 mg | CHEWABLE_TABLET | Freq: Once | ORAL | Status: AC
  Administered 2023-11-26: 81 mg via ORAL
  Filled 2023-11-26: qty 1

## 2023-11-26 MED ORDER — FENTANYL CITRATE (PF) 100 MCG/2ML IJ SOLN
INTRAMUSCULAR | Status: AC
  Filled 2023-11-26: qty 2

## 2023-11-26 MED ORDER — IOHEXOL 350 MG/ML SOLN
INTRAVENOUS | Status: DC | PRN
  Administered 2023-11-26: 65 mL

## 2023-11-26 SURGICAL SUPPLY — 9 items
CATH 5FR JL3.5 JR4 ANG PIG MP (CATHETERS) IMPLANT
DEVICE RAD COMP TR BAND LRG (VASCULAR PRODUCTS) IMPLANT
GLIDESHEATH SLEND SS 6F .021 (SHEATH) IMPLANT
GUIDEWIRE INQWIRE 1.5J.035X260 (WIRE) IMPLANT
KIT SINGLE USE MANIFOLD (KITS) IMPLANT
PACK CARDIAC CATHETERIZATION (CUSTOM PROCEDURE TRAY) ×1 IMPLANT
SET ATX-X65L (MISCELLANEOUS) IMPLANT
SHEATH PROBE COVER 6X72 (BAG) IMPLANT
WIRE HI TORQ VERSACORE 300 (WIRE) IMPLANT

## 2023-11-26 NOTE — Interval H&P Note (Signed)
 History and Physical Interval Note:  11/26/2023 7:26 AM  [name] Hampton  has presented today for surgery, with the diagnosis of atypical chest pain and abnormal coronary CTA  The various methods of treatment have been discussed with the patient and family. After consideration of risks, benefits and other options for treatment, the patient has consented to  Procedure(s): LEFT HEART CATH AND CORONARY ANGIOGRAPHY (N/A) as a surgical intervention.  The patient's history has been reviewed, patient examined, no change in status, stable for surgery.  I have reviewed the patient's chart and labs.  Questions were answered to the patient's satisfaction.    Cath Lab Visit (complete for each Cath Lab visit)  Clinical Evaluation Leading to the Procedure:   ACS: No.  Non-ACS:    Anginal Classification: CCS IV  Anti-ischemic medical therapy: No Therapy  Non-Invasive Test Results: Equivocal test results (possible moderate to severe ostial LMCA stenosis on coronary CTA)  Prior CABG: No previous CABG  [name] Hampton

## 2023-11-26 NOTE — Discharge Instructions (Signed)
 Radial Site Care The following information offers guidance on how to care for yourself after your procedure. Your health care provider may also give you more specific instructions. If you have problems or questions, contact your health care provider. What can I expect after the procedure? After the procedure, it is common to have bruising and tenderness in the incision area. Follow these instructions at home: Incision site care  Follow instructions from your health care provider about how to take care of your incision site. Make sure you: Wash your hands with soap and water for at least 20 seconds before and after you change your bandage (dressing). If soap and water are not available, use hand sanitizer. Remove your dressing in 24 hours. Leave stitches (sutures), skin glue, or adhesive strips in place. These skin closures may need to stay in place for 2 weeks or longer. If adhesive strip edges start to loosen and curl up, you may trim the loose edges. Do not remove adhesive strips completely unless your health care provider tells you to do that. Do not take baths, swim, or use a hot tub for at least 1 week. You may shower 24 hours after the procedure or as told by your health care provider. Remove the dressing and gently wash the incision area with plain soap and water. Pat the area dry with a clean towel. Do not rub the site. That could cause bleeding. Do not apply powder or lotion to the site. Check your incision site every day for signs of infection. Check for: Redness, swelling, or pain. Fluid or blood. Warmth. Pus or a bad smell. Activity For 24 hours after the procedure, or as directed by your health care provider: Do not flex or bend the affected arm. Do not push or pull heavy objects with the affected arm. Do not operate machinery or power tools. Do not drive. You should not drive yourself home from the hospital or clinic if you go home during that time period. You may drive 24  hours after the procedure unless your health care provider tells you not to. Do not lift anything that is heavier than 10 lb (4.5 kg), or the limit that you are told, until your health care provider says that it is safe. Return to your normal activities as told by your health care provider. Ask your health care provider what activities are safe for you and when you can return to work. If you were given a sedative during the procedure, it can affect you for several hours. Do not drive or operate machinery until your health care provider says that it is safe. General instructions Take over-the-counter and prescription medicines only as told by your health care provider. If you will be going home right after the procedure, plan to have a responsible adult care for you for the time you are told. This is important. Keep all follow-up visits. This is important. Contact a health care provider if: You have a fever or chills. You have any of these signs of infection at your incision site: Redness, swelling, or pain. Fluid or blood. Warmth. Pus or a bad smell. Get help right away if: The incision area swells very fast. The incision area is bleeding, and the bleeding does not stop when you hold steady pressure on the area. Your arm or hand becomes pale, cool, tingly, or numb. These symptoms may represent a serious problem that is an emergency. Do not wait to see if the symptoms will go away. Get medical  help right away. Call your local emergency services (911 in the [place] Do not drive yourself to the hospital. Summary After the procedure, it is common to have bruising and tenderness at the incision site. Follow instructions from your health care provider about how to take care of your radial site incision. Check the incision every day for signs of infection. Do not lift anything that is heavier than 10 lb (4.5 kg), or the limit that you are told, until your health care provider says that it is  safe. Get help right away if the incision area swells very fast, you have bleeding at the incision site that will not stop, or your arm or hand becomes pale, cool, or numb. This information is not intended to replace advice given to you by your health care provider. Make sure you discuss any questions you have with your health care provider. Document Revised: 08/15/2020 Document Reviewed: 08/15/2020 Elsevier Patient Education  2024 [employer].

## 2023-12-10 ENCOUNTER — Ambulatory Visit (INDEPENDENT_AMBULATORY_CARE_PROVIDER_SITE_OTHER): Payer: Self-pay

## 2023-12-10 ENCOUNTER — Ambulatory Visit: Payer: Self-pay

## 2023-12-10 ENCOUNTER — Ambulatory Visit (HOSPITAL_BASED_OUTPATIENT_CLINIC_OR_DEPARTMENT_OTHER): Admit: 2023-12-10 | Payer: Self-pay | Admitting: Orthopedic Surgery

## 2023-12-10 ENCOUNTER — Encounter (HOSPITAL_BASED_OUTPATIENT_CLINIC_OR_DEPARTMENT_OTHER): Payer: Self-pay

## 2023-12-10 VITALS — BP 102/72 | HR 70 | Resp 18 | Ht 61.6 in | Wt 169.6 lb

## 2023-12-10 DIAGNOSIS — R7989 Other specified abnormal findings of blood chemistry: Secondary | ICD-10-CM | POA: Insufficient documentation

## 2023-12-10 DIAGNOSIS — R002 Palpitations: Secondary | ICD-10-CM

## 2023-12-10 DIAGNOSIS — I25119 Atherosclerotic heart disease of native coronary artery with unspecified angina pectoris: Secondary | ICD-10-CM

## 2023-12-10 HISTORY — DX: Other specified abnormal findings of blood chemistry: R79.89

## 2023-12-10 SURGERY — INSERTION, INTRAMEDULLARY ROD, METACARPAL BONE
Anesthesia: Regional | Laterality: Left

## 2023-12-10 NOTE — Assessment & Plan Note (Addendum)
 Cardiac cath showed minimal nonobstructive disease and no significant left main disease.  Continue aspirin  81 mg once daily. Low-dose metoprolol  succinate 12.5 mg once daily for any significant microvascular dysfunction.   Her atypical symptoms are not due to any significant cardiac structure or function abnormalities or coronary disease. She is still continues to report symptoms and was wondering if she has any significant abnormal heart rhythms.  Will follow-up with a heart monitor for 2 weeks.  From cardiac standpoint okay to proceed with activities as tolerated, no limitations.

## 2023-12-10 NOTE — Patient Instructions (Signed)
 Medication Instructions:  Your physician recommends that you continue on your current medications as directed. Please refer to the Current Medication list given to you today.  *If you need a refill on your cardiac medications before your next appointment, please call your pharmacy*   Lab Work: None ordered If you have labs (blood work) drawn today and your tests are completely normal, you will receive your results only by: MyChart Message (if you have MyChart) OR A paper copy in the mail If you have any lab test that is abnormal or we need to change your treatment, we will call you to review the results.   Testing/Procedures: A zio monitor was ordered today. It will remain on for 14 days. Remove 10/24/23. You will then return monitor and event diary in provided box. It takes 1-2 weeks for report to be downloaded and returned to us . We will call you with the results. If monitor falls off or has orange flashing light, please call Zio for further instructions.    Follow-Up: At [facility], you and your health needs are our priority.  As part of our continuing mission to provide you with exceptional heart care, we have created designated Provider Care Teams.  These Care Teams include your primary Cardiologist (physician) and Advanced Practice Providers (APPs -  Physician Assistants and Nurse Practitioners) who all work together to provide you with the care you need, when you need it.  We recommend signing up for the patient portal called "MyChart".  Sign up information is provided on this After Visit Summary.  MyChart is used to connect with patients for Virtual Visits (Telemedicine).  Patients are able to view lab/test results, encounter notes, upcoming appointments, etc.  Non-urgent messages can be sent to your provider as well.   To learn more about what you can do with MyChart, go to [email or web address].    Your next appointment:   3 month(s)  The format for your next  appointment:   In Person  Provider:   Bertha Broad, MD    Other Instructions none  Important Information About Sugar

## 2023-12-10 NOTE — Assessment & Plan Note (Signed)
 Elevated TSH levels with normal free T3 and T4. TSH levels trending up over the past couple months. Advised to establish care with PCP which they are in the process of at this time. Will need close monitoring and probably replacement with levothyroxine if TSH continues to trend up.
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
## 2024-01-13 ENCOUNTER — Encounter (HOSPITAL_COMMUNITY): Payer: Self-pay | Admitting: Emergency Medicine

## 2024-01-13 ENCOUNTER — Emergency Department (HOSPITAL_COMMUNITY)
Admission: EM | Admit: 2024-01-13 | Discharge: 2024-01-13 | Disposition: A | Discharge status: Home / Self Care | Payer: Self-pay | Attending: Emergency Medicine | Admitting: Emergency Medicine

## 2024-01-13 ENCOUNTER — Other Ambulatory Visit: Payer: Self-pay

## 2024-01-13 ENCOUNTER — Emergency Department (HOSPITAL_COMMUNITY): Payer: Self-pay

## 2024-01-13 DIAGNOSIS — S6391XA Sprain of unspecified part of right wrist and hand, initial encounter: Secondary | ICD-10-CM | POA: Insufficient documentation

## 2024-01-13 DIAGNOSIS — S6390XA Sprain of unspecified part of unspecified wrist and hand, initial encounter: Secondary | ICD-10-CM

## 2024-01-13 DIAGNOSIS — W010XXA Fall on same level from slipping, tripping and stumbling without subsequent striking against object, initial encounter: Secondary | ICD-10-CM | POA: Insufficient documentation

## 2024-01-13 DIAGNOSIS — Z87891 Personal history of nicotine dependence: Secondary | ICD-10-CM | POA: Insufficient documentation

## 2024-01-13 MED ORDER — NAPROXEN 250 MG PO TABS
500.0000 mg | ORAL_TABLET | Freq: Once | ORAL | Status: AC
  Administered 2024-01-13: 500 mg via ORAL
  Filled 2024-01-13: qty 2

## 2024-01-13 NOTE — Discharge Instructions (Signed)
 You were evaluated in the Emergency Department and after careful evaluation, we did not find any emergent condition requiring admission or further testing in the hospital.  Your exam/testing today is overall reassuring.  X-ray did not show any significant injuries or broken bones.  Use Tylenol  or Motrin  for discomfort.  Please return to the Emergency Department if you experience any worsening of your condition.   Thank you for allowing us  to be a part of your care.

## 2024-01-13 NOTE — ED Notes (Signed)
 Portable xray at bedside.

## 2024-01-13 NOTE — ED Triage Notes (Signed)
 Pt states she fell last night around 8pm and c/o pain to R hand.
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
## 2024-02-29 ENCOUNTER — Emergency Department (HOSPITAL_COMMUNITY)
Admission: EM | Admit: 2024-02-29 | Discharge: 2024-02-29 | Disposition: A | Discharge status: Home / Self Care | Attending: Emergency Medicine | Admitting: Emergency Medicine

## 2024-02-29 ENCOUNTER — Emergency Department (HOSPITAL_COMMUNITY)

## 2024-02-29 ENCOUNTER — Other Ambulatory Visit: Payer: Self-pay

## 2024-02-29 ENCOUNTER — Encounter (HOSPITAL_COMMUNITY): Payer: Self-pay

## 2024-02-29 DIAGNOSIS — R002 Palpitations: Secondary | ICD-10-CM | POA: Insufficient documentation

## 2024-02-29 DIAGNOSIS — Z7982 Long term (current) use of aspirin: Secondary | ICD-10-CM | POA: Insufficient documentation

## 2024-02-29 LAB — CBC
HCT: 41.9 % (ref 36.0–46.0)
Hemoglobin: 14.5 g/dL (ref 12.0–15.0)
MCH: 31.1 pg (ref 26.0–34.0)
MCHC: 34.6 g/dL (ref 30.0–36.0)
MCV: 89.9 fL (ref 80.0–100.0)
Platelets: 327 K/uL (ref 150–400)
RBC: 4.66 MIL/uL (ref 3.87–5.11)
RDW: 11.9 % (ref 11.5–15.5)
WBC: 9.6 K/uL (ref 4.0–10.5)
nRBC: 0 % (ref 0.0–0.2)

## 2024-02-29 LAB — URINALYSIS, ROUTINE W REFLEX MICROSCOPIC
Bilirubin Urine: NEGATIVE
Glucose, UA: NEGATIVE mg/dL
Hgb urine dipstick: NEGATIVE
Ketones, ur: NEGATIVE mg/dL
Leukocytes,Ua: NEGATIVE
Nitrite: NEGATIVE
Protein, ur: NEGATIVE mg/dL
Specific Gravity, Urine: 1.005 (ref 1.005–1.030)
pH: 6 (ref 5.0–8.0)

## 2024-02-29 LAB — TROPONIN I (HIGH SENSITIVITY): Troponin I (High Sensitivity): <2 ng/L (ref <18)

## 2024-02-29 LAB — BASIC METABOLIC PANEL WITH GFR
Anion gap: 11 (ref 5–15)
BUN: 9 mg/dL (ref 6–20)
CO2: 23 mmol/L (ref 22–32)
Calcium: 8.9 mg/dL (ref 8.9–10.3)
Chloride: 106 mmol/L (ref 98–111)
Creatinine, Ser: 0.79 mg/dL (ref 0.44–1.00)
GFR, Estimated: >60 mL/min (ref >60)
Glucose, Bld: 99 mg/dL (ref 70–99)
Potassium: 4 mmol/L (ref 3.5–5.1)
Sodium: 140 mmol/L (ref 135–145)

## 2024-02-29 LAB — PREGNANCY, URINE: Preg Test, Ur: NEGATIVE

## 2024-02-29 MED ORDER — SODIUM CHLORIDE 0.9 % IV BOLUS
1000.0000 mL | Freq: Once | INTRAVENOUS | Status: AC
  Administered 2024-02-29: 1000 mL via INTRAVENOUS

## 2024-02-29 NOTE — ED Triage Notes (Signed)
 Pt stated that she is having wonky heart beats and chest pain and her metoprolol  is not working. Pt stated that this episode started through the night

## 2024-02-29 NOTE — Discharge Instructions (Signed)
 Make sure you are staying well-hydrated.  Avoid nicotine or caffeine.  Please follow-up with your heart doctor on the third as discussed.  Return to emergency room with new or worsening symptoms.  I would recommend having TSH, T4 (thyroid  function) rechecked with PCP at the time of follow up.

## 2024-02-29 NOTE — ED Notes (Signed)
 ED Provider at bedside.

## 2024-02-29 NOTE — ED Provider Notes (Signed)
 [facility] EMERGENCY DEPARTMENT AT [facility] Provider Note   CSN: [id number] Arrival date & time: 02/29/24  1615     Patient presents with: Chest Pain   [name] Hampton is a 43 y.o. female with past medical history of elevated TSH, palpitations presenting to emergency room with complaint of palpitations.  Patient reports when she woke up this morning around 10 she had several hours of feeling fluttering or palpitations in her chest.  She reports she has been having symptoms like this ongoing for a while but they have been well-managed with metoprolol .  When she checked her heart rate during this episode her Apple Watch showed a heart rate of 80.  She denies any palpitations right now.  Denies chest pain shortness of breath or swelling in extremities.  She reports that she is following with cardiology for this and has a follow-up appointment September 3.  Also notes urinary frequency.  Denies fever or chills.    Chest Pain      Prior to Admission medications   Medication Sig Start Date End Date Taking? Authorizing Provider  aspirin  EC 81 MG tablet Take 81 mg by mouth daily.    [provider]  loratadine (CLARITIN) 10 MG tablet Take 10 mg by mouth daily.    [provider]  LORazepam  (ATIVAN ) 1 MG tablet Take 1 tablet (1 mg total) by mouth 3 (three) times daily as needed for anxiety. 11/24/23   Yolande Lamar BROCKS, MD  metoprolol  succinate (TOPROL  XL) 25 MG 24 hr tablet Take 0.5 tablets (12.5 mg total) by mouth daily. 11/26/23 11/25/24  End, Lonni, MD  Multiple Vitamin (MULTIVITAMIN) tablet Take 1 tablet by mouth daily. Vitafusion    [provider]  omeprazole  (PRILOSEC) 20 MG capsule Take 20 mg by mouth daily as needed (heartburn or reflex).    [provider]  OVER THE COUNTER MEDICATION Place 1 drop into both eyes daily as needed (dry eyes). Equate eye allergy relief drops    [provider]    Allergies: Peppermint oil,  Erythromycin, and Shellfish allergy    Review of Systems  Cardiovascular:  Positive for chest pain.    Updated Vital Signs BP 114/83 (BP Location: Right Arm)   Pulse 69   Temp 97.9 F (36.6 C) (Oral)   Resp 16   Ht 5' 1 (1.549 m)   Wt 81.6 kg   SpO2 100%   BMI 34.01 kg/m   Physical Exam Vitals and nursing note reviewed.  Constitutional:      General: She is not in acute distress.    Appearance: She is not toxic-appearing.  HENT:     Head: Normocephalic and atraumatic.  Eyes:     General: No scleral icterus.    Conjunctiva/sclera: Conjunctivae normal.  Cardiovascular:     Rate and Rhythm: Normal rate and regular rhythm.     Pulses: Normal pulses.     Heart sounds: Normal heart sounds.  Pulmonary:     Effort: Pulmonary effort is normal. No respiratory distress.     Breath sounds: Normal breath sounds.  Abdominal:     General: Abdomen is flat. Bowel sounds are normal.     Palpations: Abdomen is soft.     Tenderness: There is no abdominal tenderness.  Skin:    General: Skin is warm and dry.     Findings: No lesion.  Neurological:     General: No focal deficit present.     Mental Status: She is  alert and oriented to person, place, and time. Mental status is at baseline.     (all labs ordered are listed, but only abnormal results are displayed) Labs Reviewed  URINALYSIS, ROUTINE W REFLEX MICROSCOPIC - Abnormal; Notable for the following components:      Result Value   Color, Urine STRAW (*)    All other components within normal limits  BASIC METABOLIC PANEL WITH GFR  CBC  PREGNANCY, URINE  TROPONIN I (HIGH SENSITIVITY)    EKG: None  Radiology: DG Chest 2 View Result Date: 02/29/2024 CLINICAL DATA:  Chest pain. EXAM: CHEST - 2 VIEW COMPARISON:  11/24/2023 FINDINGS: The heart size and mediastinal contours are within normal limits. Both lungs are clear. The visualized skeletal structures are unremarkable. IMPRESSION: No active cardiopulmonary disease.  Electronically Signed   By: Norleen DELENA Kil M.D.   On: 02/29/2024 17:10     Procedures   Medications Ordered in the ED  sodium chloride  0.9 % bolus 1,000 mL (has no administration in time range)                                    Medical Decision Making Amount and/or Complexity of Data Reviewed Labs: ordered. Radiology: ordered.   This patient presents to the ED for concern of palpitations, this involves an extensive number of treatment options, and is a complaint that carries with it a high risk of complications and morbidity.  The differential diagnosis includes ACS, PE, thyroid  dysfunction, pneumonia, pneumothorax, arrhythmia   Additional history obtained:  Additional history obtained from office visit with cardiology 12/10/2023 in which patient was seen for palpitations and started on 12.5 mg of metoprolol . Had normal echo without any significant dysfunction and heart cath with minimal nonobstructive disease and no significant left main disease.   Lab Tests:  I personally interpreted labs.  The pertinent results include:   No electrolyte abnormality.  Normal kidney and liver function.  Troponin normal. Reviewed recent abnormal TSH but normal T4.   Imaging Studies ordered:  I ordered imaging studies including chest x-ray I independently visualized and interpreted imaging which showed no acute findings. I agree with the radiologist interpretation   Cardiac Monitoring: / EKG:  The patient was maintained on a cardiac monitor.  I personally viewed and interpreted the cardiac monitored which showed an underlying rhythm of: Normal sinus rhythm   Problem List / ED Course / Critical interventions / Medication management  Patient presented to emergency room with complaint of palpitations.  This lasted a few hours until the time she came to emergency room.  She has had some chest discomfort but denies any chest pain or shortness of breath.  She is PERC negative and symptoms do  not seem consistent with PE or DVT.  Symptoms do not seem consistent with ACS as she has chest pain that is quite atypical.  Also has quite recent reassuring catheterization.  Symptoms are not consistent with aortic dissection.  She has no evidence of pneumonia or pneumothorax on chest x-ray.  Her troponins are normal here and she has normal sinus rhythm on her EKG.  She had recent TSH checked so was not repeated today.  She will follow-up for recheck later next month.  She also has no electrolyte abnormality.  She notes some urinary frequency but no leukocytes nitrites or bacteria on UA. I ordered medication including 1L NS Reevaluation of the patient after these medicines showed  that the patient improved I have reviewed the patients home medicines and have made adjustments as needed. No evidence of arrhythmia here.  No elevation in troponin.  Patient feeling better.  Feel appropriate for discharge with outpatient follow-up.  She will consider having ZIO monitor placed.  She is already established with cardiology.         Final diagnoses:  Palpitations    ED Discharge Orders          Ordered    Ambulatory referral to Cardiology       Comments: If you have not heard from the Cardiology office within the next 72 hours please call [phone].   02/29/24 2032               Liat Mayol, Warren SAILOR, PA-C 02/29/24 2032    Suzette Pac, MD 03/03/24 [phone]
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
## 2024-03-12 ENCOUNTER — Ambulatory Visit: Payer: Self-pay

## 2024-03-12 VITALS — BP 100/80 | HR 66 | Ht 61.0 in | Wt 175.2 lb

## 2024-03-12 DIAGNOSIS — I25119 Atherosclerotic heart disease of native coronary artery with unspecified angina pectoris: Secondary | ICD-10-CM | POA: Diagnosis not present

## 2024-03-12 DIAGNOSIS — G7102 Facioscapulohumeral muscular dystrophy: Secondary | ICD-10-CM | POA: Diagnosis not present

## 2024-03-12 DIAGNOSIS — R7989 Other specified abnormal findings of blood chemistry: Secondary | ICD-10-CM

## 2024-03-12 DIAGNOSIS — R002 Palpitations: Secondary | ICD-10-CM | POA: Diagnosis not present

## 2024-03-12 NOTE — Addendum Note (Signed)
 Addended by: SHERRE ADE I on: 03/12/2024 09:28 AM   Modules accepted: Orders

## 2024-03-12 NOTE — Assessment & Plan Note (Addendum)
 Elevated TSH levels in the past with normal T4.  TSH levels had been trending up. Still not establish care with PCP. They are in the process.  Will reassess thyroid  panel. If abnormal T3-T4, TSH continue to trend, will consider starting low-dose levothyroxine. Will also require further testing with imaging and I strongly encouraged him to establish care with PCP.
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
## 2024-03-12 NOTE — Addendum Note (Signed)
 Addended by: SHERRE ADE I on: 03/12/2024 09:29 AM   Modules accepted: Orders

## 2024-03-12 NOTE — Assessment & Plan Note (Signed)
 Asymptomatic at this time. Continue with aspirin  81 mg once daily. Continue with low-dose metoprolol  succinate 12.5 mg once daily.  Avoid titrating up the dose given concerns about any depression.  Not on statins given her reported history of muscular dystrophy. Will obtain fasting lipid panel to assess her cholesterol levels and if not optimal will consider Zetia or PCSK9 inhibitors if cost affordable.

## 2024-03-12 NOTE — Patient Instructions (Signed)
 Medication Instructions:  Your physician recommends that you continue on your current medications as directed. Please refer to the Current Medication list given to you today.  *If you need a refill on your cardiac medications before your next appointment, please call your pharmacy*  Lab Work: Your physician recommends that you return for lab work in:   Labs today: Lipids, TSH T3 T4  If you have labs (blood work) drawn today and your tests are completely normal, you will receive your results only by: MyChart Message (if you have MyChart) OR A paper copy in the mail If you have any lab test that is abnormal or we need to change your treatment, we will call you to review the results.  Testing/Procedures: None  Follow-Up: At [facility], you and your health needs are our priority.  As part of our continuing mission to provide you with exceptional heart care, our providers are all part of one team.  This team includes your primary Cardiologist (physician) and Advanced Practice Providers or APPs (Physician Assistants and Nurse Practitioners) who all work together to provide you with the care you need, when you need it.  Your next appointment:   6 month(s)  Provider:   Alean Kobus, MD    We recommend signing up for the patient portal called MyChart.  Sign up information is provided on this After Visit Summary.  MyChart is used to connect with patients for Virtual Visits (Telemedicine).  Patients are able to view lab/test results, encounter notes, upcoming appointments, etc.  Non-urgent messages can be sent to your provider as well.   To learn more about what you can do with MyChart, go to [email or web address].   Other Instructions None

## 2024-03-12 NOTE — Assessment & Plan Note (Signed)
 Reports family history on the maternal side for this disease. Reports blood work done in her teens that identified the genetic component. Physically she reports symptoms of facial weakness at times and lack of muscle strength. Has not had any neurological workup. Has not yet established care with neurologist. Mentions that he recently had health insurance set up and still in the process of setting up with a PCP.  They are agreeable to get further evaluated with neurologist as inform them that the disease is a rate condition and may require further evaluation genetic testing, nerve and muscle conduction testing which could identify if she is in fact carrying the disease or manifesting the disease.  They are agreeable.  Will place a referral.

## 2024-03-13 ENCOUNTER — Ambulatory Visit: Payer: Self-pay

## 2024-03-13 LAB — LIPID PANEL
Chol/HDL Ratio: 5.3 ratio — ABNORMAL HIGH (ref 0.0–4.4)
Cholesterol, Total: 219 mg/dL — ABNORMAL HIGH (ref 100–199)
HDL: 41 mg/dL (ref >39)
LDL Chol Calc (NIH): 168 mg/dL — ABNORMAL HIGH (ref 0–99)
Triglycerides: 56 mg/dL (ref 0–149)
VLDL Cholesterol Cal: 10 mg/dL (ref 5–40)

## 2024-03-13 LAB — TSH+T4F+T3FREE
Free T4: 0.99 ng/dL (ref 0.82–1.77)
T3, Free: 2.9 pg/mL (ref 2.0–4.4)
TSH: 6.63 u[IU]/mL — ABNORMAL HIGH (ref 0.450–4.500)

## 2024-03-24 ENCOUNTER — Encounter: Payer: Self-pay | Admitting: Neurology

## 2024-03-24 ENCOUNTER — Ambulatory Visit (INDEPENDENT_AMBULATORY_CARE_PROVIDER_SITE_OTHER): Admitting: Neurology

## 2024-03-24 VITALS — BP 116/70 | HR 70 | Ht 61.0 in | Wt 175.0 lb

## 2024-03-24 DIAGNOSIS — G7102 Facioscapulohumeral muscular dystrophy: Secondary | ICD-10-CM | POA: Diagnosis not present

## 2024-03-24 MED ORDER — DULOXETINE HCL 30 MG PO CPEP: 30.0000 mg | ORAL_CAPSULE | Freq: Every day | ORAL | 11 refills | Status: AC
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
## 2024-04-15 ENCOUNTER — Encounter: Payer: Self-pay | Admitting: Obstetrics & Gynecology

## 2024-04-15 ENCOUNTER — Other Ambulatory Visit (HOSPITAL_COMMUNITY)
Admission: RE | Admit: 2024-04-15 | Discharge: 2024-04-15 | Disposition: A | Discharge status: Home / Self Care | Source: Ambulatory Visit | Attending: Obstetrics & Gynecology | Admitting: Obstetrics & Gynecology

## 2024-04-15 ENCOUNTER — Ambulatory Visit: Admitting: Obstetrics & Gynecology

## 2024-04-15 VITALS — BP 106/74 | HR 66 | Ht 61.0 in | Wt 174.0 lb

## 2024-04-15 DIAGNOSIS — Z1331 Encounter for screening for depression: Secondary | ICD-10-CM

## 2024-04-15 DIAGNOSIS — Z1231 Encounter for screening mammogram for malignant neoplasm of breast: Secondary | ICD-10-CM

## 2024-04-15 DIAGNOSIS — Z124 Encounter for screening for malignant neoplasm of cervix: Secondary | ICD-10-CM | POA: Insufficient documentation

## 2024-04-15 DIAGNOSIS — Z1151 Encounter for screening for human papillomavirus (HPV): Secondary | ICD-10-CM

## 2024-04-15 DIAGNOSIS — Z01419 Encounter for gynecological examination (general) (routine) without abnormal findings: Secondary | ICD-10-CM | POA: Diagnosis not present

## 2024-04-15 NOTE — Progress Notes (Signed)
 WELL-WOMAN EXAMINATION Patient name: [name] Hampton MRN [id number]  Date of birth: 08-Jul-1981 Chief Complaint:   Annual Exam  History of Present Illness:   [name] Hampton is a 43 y.o. G66P1002 female being seen today for a routine well-woman exam.   H/o endometrial ablation 2014  Pt will note light bleeding for a week and then mucus-discharge for about  a week.  Some dysmenorrhea.  While it is not ideal, she notes that current setting is tolerable  Denies regular discharge, itching or irritation.  She may note occasional left-sided pelvic pain for 1 to 2 days that she thinks is related to ovulation.  She denies pelvic pain currently  Notes mood changes and new level of OCD that wasn't present before-feels like she is going through the menopausal change  No LMP recorded (lmp unknown). Patient has had an ablation.  The current method of family planning is tubal ligation.    Last pap due today.  Last mammogram: due today. Last colonoscopy: NA     04/15/2024   10:39 AM  Depression screen PHQ 2/9  Decreased Interest 2  Down, Depressed, Hopeless 1  PHQ - 2 Score 3  Altered sleeping 3  Tired, decreased energy 3  Change in appetite 3  Feeling bad or failure about yourself  2  Moving slowly or fidgety/restless 1  Suicidal thoughts 0  PHQ-9 Score 15      Review of Systems:   Pertinent items are noted in HPI Denies any headaches, blurred vision, fatigue, shortness of breath, chest pain, abdominal pain, bowel movements, urination, or intercourse unless otherwise stated above.  Pertinent History Reviewed:  Reviewed past medical,surgical, social and family history.  Reviewed problem list, medications and allergies. Physical Assessment:   Vitals:   04/15/24 1029  BP: 106/74  Pulse: 66  Weight: 174 lb (78.9 kg)  Height: 5' 1 (1.549 m)  Body mass index is 32.88 kg/m.        Physical Examination:   General appearance - well appearing, and in no distress  Mental status -  alert, oriented to person, place, and time  Psych:  She has a normal mood and affect  Skin - warm and dry, normal color, no suspicious lesions noted  Chest - effort normal, all lung fields clear to auscultation bilaterally  Heart - normal rate and regular rhythm  Neck:  midline trachea, no thyromegaly or nodules  Breasts - breasts appear normal, no suspicious masses, no skin or nipple changes or  axillary nodes  Abdomen - soft, nontender, nondistended, no masses or organomegaly  Pelvic - VULVA: normal appearing vulva with no masses, tenderness or lesions  VAGINA: normal appearing vagina with normal color and discharge, no lesions  CERVIX: normal appearing cervix without discharge or lesions, no CMT  Thin prep pap is done with HR HPV cotesting  UTERUS: uterus is felt to be normal size, shape, consistency and nontender   ADNEXA: No adnexal masses or tenderness noted.  Extremities:  No swelling or varicosities noted  Chaperone: Aleck Blase     Assessment & Plan:  1) Well-Woman Exam -Pap collected, reviewed ASCCP guidelines - Mammogram ordered reviewed screening guidelines  2) Perimenopausal -reviewed OTC supplements and lifestyle modifications to help with symptoms  []  Should she note more significant or continued pelvic pain, RTC to office for ultrasound  Orders Placed This Encounter  Procedures   MM 3D SCREENING MAMMOGRAM BILATERAL BREAST    Meds: No orders of the defined types were placed  in this encounter.   Follow-up: Return in about 1 year (around 04/15/2025) for 1-57yr annual.   Wilfred Dayrit, DO Attending Obstetrician & Gynecologist, [facility], [facility]

## 2024-04-16 ENCOUNTER — Ambulatory Visit: Payer: Self-pay | Admitting: Obstetrics & Gynecology

## 2024-04-16 LAB — CYTOLOGY - PAP
Comment: NEGATIVE
Diagnosis: NEGATIVE
High risk HPV: NEGATIVE

## 2024-08-07 ENCOUNTER — Ambulatory Visit: Admitting: Neurology

## 2025-03-31 ENCOUNTER — Ambulatory Visit: Admitting: Neurology
